# Patient Record
Sex: Female | Born: 1935 | Race: Black or African American | Hispanic: No | Smoking: Never smoker
Health system: Southern US, Community
[De-identification: ages and names within clinical notes are randomized; demographics above are authoritative.]

## PROBLEM LIST (undated history)

## (undated) DIAGNOSIS — D72819 Decreased white blood cell count, unspecified: Secondary | ICD-10-CM

## (undated) DIAGNOSIS — I499 Cardiac arrhythmia, unspecified: Secondary | ICD-10-CM

## (undated) DIAGNOSIS — R112 Nausea with vomiting, unspecified: Secondary | ICD-10-CM

## (undated) DIAGNOSIS — M1990 Unspecified osteoarthritis, unspecified site: Secondary | ICD-10-CM

## (undated) DIAGNOSIS — Z9889 Other specified postprocedural states: Secondary | ICD-10-CM

## (undated) DIAGNOSIS — T4145XA Adverse effect of unspecified anesthetic, initial encounter: Secondary | ICD-10-CM

## (undated) DIAGNOSIS — T8859XA Other complications of anesthesia, initial encounter: Secondary | ICD-10-CM

## (undated) DIAGNOSIS — I10 Essential (primary) hypertension: Secondary | ICD-10-CM

## (undated) DIAGNOSIS — E785 Hyperlipidemia, unspecified: Secondary | ICD-10-CM

## (undated) HISTORY — PX: APPENDECTOMY: SHX54

## (undated) HISTORY — PX: BREAST BIOPSY: SHX20

## (undated) HISTORY — PX: EYE SURGERY: SHX253

## (undated) HISTORY — PX: ABDOMINAL HYSTERECTOMY: SHX81

---

## 2003-11-07 ENCOUNTER — Ambulatory Visit: Payer: Self-pay | Admitting: Family Medicine

## 2004-04-24 ENCOUNTER — Ambulatory Visit: Payer: Self-pay | Admitting: Family Medicine

## 2004-04-28 ENCOUNTER — Ambulatory Visit: Payer: Self-pay | Admitting: Family Medicine

## 2004-09-15 ENCOUNTER — Ambulatory Visit: Payer: Self-pay | Admitting: Gastroenterology

## 2004-11-07 ENCOUNTER — Ambulatory Visit: Payer: Self-pay | Admitting: Internal Medicine

## 2005-11-09 ENCOUNTER — Ambulatory Visit: Payer: Self-pay | Admitting: Internal Medicine

## 2006-11-11 ENCOUNTER — Ambulatory Visit: Payer: Self-pay | Admitting: Internal Medicine

## 2007-11-14 ENCOUNTER — Ambulatory Visit: Payer: Self-pay | Admitting: Internal Medicine

## 2008-11-14 ENCOUNTER — Ambulatory Visit: Payer: Self-pay | Admitting: Internal Medicine

## 2009-11-15 ENCOUNTER — Ambulatory Visit: Payer: Self-pay | Admitting: Internal Medicine

## 2010-11-18 ENCOUNTER — Ambulatory Visit: Payer: Self-pay | Admitting: Internal Medicine

## 2011-04-15 ENCOUNTER — Ambulatory Visit: Payer: Self-pay | Admitting: Internal Medicine

## 2011-11-19 ENCOUNTER — Ambulatory Visit: Payer: Self-pay | Admitting: Internal Medicine

## 2012-11-22 ENCOUNTER — Ambulatory Visit: Payer: Self-pay | Admitting: Internal Medicine

## 2013-03-11 ENCOUNTER — Ambulatory Visit: Payer: Self-pay | Admitting: Family Medicine

## 2013-03-11 ENCOUNTER — Observation Stay: Payer: Self-pay | Admitting: Surgery

## 2013-03-11 LAB — COMPREHENSIVE METABOLIC PANEL
ALBUMIN: 4.1 g/dL (ref 3.4–5.0)
ALT: 24 U/L (ref 12–78)
ANION GAP: 11 (ref 7–16)
AST: 21 U/L (ref 15–37)
Alkaline Phosphatase: 133 U/L — ABNORMAL HIGH
BILIRUBIN TOTAL: 0.4 mg/dL (ref 0.2–1.0)
BUN: 19 mg/dL — ABNORMAL HIGH (ref 7–18)
Calcium, Total: 9.4 mg/dL (ref 8.5–10.1)
Chloride: 102 mmol/L (ref 98–107)
Co2: 26 mmol/L (ref 21–32)
Creatinine: 1.01 mg/dL (ref 0.60–1.30)
EGFR (Non-African Amer.): 54 — ABNORMAL LOW
Glucose: 134 mg/dL — ABNORMAL HIGH (ref 65–99)
Osmolality: 282 (ref 275–301)
POTASSIUM: 4.5 mmol/L (ref 3.5–5.1)
Sodium: 139 mmol/L (ref 136–145)
Total Protein: 8.2 g/dL (ref 6.4–8.2)

## 2013-03-11 LAB — CBC WITH DIFFERENTIAL/PLATELET
Basophil #: 0 10*3/uL (ref 0.0–0.1)
Basophil %: 0.4 %
EOS ABS: 0 10*3/uL (ref 0.0–0.7)
EOS PCT: 0.1 %
HCT: 41.3 % (ref 35.0–47.0)
HGB: 13.5 g/dL (ref 12.0–16.0)
Lymphocyte #: 0.6 10*3/uL — ABNORMAL LOW (ref 1.0–3.6)
Lymphocyte %: 7.8 %
MCH: 31.2 pg (ref 26.0–34.0)
MCHC: 32.7 g/dL (ref 32.0–36.0)
MCV: 95 fL (ref 80–100)
MONO ABS: 0.2 x10 3/mm (ref 0.2–0.9)
Monocyte %: 1.9 %
NEUTROS PCT: 89.8 %
Neutrophil #: 7.3 10*3/uL — ABNORMAL HIGH (ref 1.4–6.5)
PLATELETS: 211 10*3/uL (ref 150–440)
RBC: 4.34 10*6/uL (ref 3.80–5.20)
RDW: 14.2 % (ref 11.5–14.5)
WBC: 8.1 10*3/uL (ref 3.6–11.0)

## 2013-03-11 LAB — URINALYSIS, COMPLETE
Bilirubin,UR: NEGATIVE
GLUCOSE, UR: NEGATIVE mg/dL (ref 0–75)
Ketone: NEGATIVE
LEUKOCYTE ESTERASE: NEGATIVE
NITRITE: NEGATIVE
Ph: 6 (ref 4.5–8.0)
Protein: 30
Specific Gravity: 1.02 (ref 1.003–1.030)

## 2013-03-11 LAB — SEDIMENTATION RATE: ERYTHROCYTE SED RATE: 42 mm/h — AB (ref 0–30)

## 2013-03-11 LAB — LIPASE, BLOOD: Lipase: 135 U/L (ref 73–393)

## 2013-03-14 LAB — PATHOLOGY REPORT

## 2013-12-21 DIAGNOSIS — R0902 Hypoxemia: Secondary | ICD-10-CM | POA: Insufficient documentation

## 2014-01-11 ENCOUNTER — Ambulatory Visit: Payer: Self-pay | Admitting: Internal Medicine

## 2014-05-26 NOTE — Op Note (Signed)
PATIENT NAME:  Anita Horne, Anita Horne MR#:  [id number] DATE OF BIRTH:  20-Jul-1933  DATE OF PROCEDURE:  03/11/2013  PREOPERATIVE DIAGNOSIS:  Acute appendicitis.   POSTOPERATIVE DIAGNOSIS:  Acute appendicitis, ruptured.   PROCEDURE PERFORMED:  Laparoscopic appendectomy.   ESTIMATED BLOOD LOSS:  10 mL.  COMPLICATIONS:  None.   SPECIMEN:  Appendix.   INDICATION FOR SURGERY:  [name] is a pleasant 81 year old female who presents with acute onset right lower quadrant pain and CT findings concerning for appendicitis.   DETAILS OF PROCEDURE:  Informed consent was obtained.  [name] was brought to the Operating Room.  She was laid supine on the Operating Room table.  She was induced.  Endotracheal tube was placed.  General anesthesia was administered.  Her abdomen was then prepped and draped in a standard surgical fashion.  A timeout was then performed correctly identifying the patient name, operative site and procedure to be performed.  A supraumbilical incision was made and deepened to her fascia.  The fascia was incised.  The peritoneum was entered.  Two stay sutures were placed through the fasciotomy.  A left lower quadrant and suprapubic 5 mm trocars are placed.  The appendix was visualized.  It appeared to be more in the right upper quadrant region.  The terminal ileum was adhesed to the right sidewall and the right colon was quite cephalad.  The defect was made in the mesoappendix.  There was a fecalith at the base of the cecum, but also was perforation distally with a small amount of feculent material.  The mesoappendix was then taken down with a stapler.  The appendix was taken out with an Endo Catch bag through the umbilicus.  The staple lines were examined.  They were noted to be hemostatic.  I did put Surgicel as the patient is a Jehovah's Witness at the staple lines and irrigated the abdomen.  Once I was happy with hemostasis, the trocars were removed under direct visualization.  The  supraumbilical fascia was closed with a figure of eight 0 Vicryl.  4-0 Monocryl deep dermal sutures were used to close the skin.  Steri-Strips, Telfa gauze and Tegaderm were then used to complete the dressing.  The patient was then awoken, extubated and brought to the postanesthesia care unit.  There were no immediate complications.  Needle, sponge, and instrument count was correct at the end of the procedure.    ____________________________ Si Raiderhristopher A. Samera Macy, MD cal:ea D: 03/11/2013 22:50:20 ET T: 03/11/2013 23:17:41 ET JOB#: [id number]  cc: Cristal Deerhristopher A. Camrynn Mcclintic, MD, <Dictator> Jarvis NewcomerHRISTOPHER A Andrius Andrepont MD ELECTRONICALLY SIGNED 03/12/2013 23:55

## 2014-05-26 NOTE — H&P (Signed)
History of Present Illness 29 yof who developed RLQ pain and nausea (no vomiting, anorexia, or fever) last night at 11 PM. She wasn't able to sleep. Last BM nl, has been NPO since 3 PM yesterday (24 hours).   Past Med/Surgical Hx:  pulmonary hypertension:   Atrial Fibrillation:   Hypertension:   ALLERGIES:  General anesthesia: N/V/Diarrhea  HOME MEDICATIONS: Medication Instructions Status  acetaminophen-codeine 300 mg-30 mg oral tablet 1 tab(s) orally every 6 hours as needed for pain.  May cause drowsiness. Active  ondansetron 4 mg oral tablet, disintegrating 1 tab(s) orally every 6 hours as needed for nausea Active  fluticasone nasal 50 mcg/inh spray 1 spray(s) to each nostril once a day for sinus congestion Active  Aspirin 325 milligram(s) orally once a day Active  diltiazem  orally  Active  multivitamin  orally  Active   Family and Social History:  Family History Non-Contributory   Social History negative tobacco, negative ETOH, negative Illicit drugs, Jehovah's Witness, retired Lawyer, lives alone, no smoking or ETOH.   Place of Living Home   Review of Systems:  Fever/Chills No   Cough No   Sputum No   Abdominal Pain Yes   Diarrhea No   Constipation No   Nausea/Vomiting Yes   SOB/DOE No   Chest Pain No   Dysuria No   Tolerating PT Yes   Tolerating Diet Yes  Nauseated   Medications/Allergies Reviewed Medications/Allergies reviewed   Physical Exam:  GEN well developed, well nourished, obese   HEENT pink conjunctivae, PERRL, hearing intact to voice, moist oral mucosa, Oropharynx clear   NECK supple  No masses  trachea midline   RESP normal resp effort  clear BS  no use of accessory muscles   CARD regular rate  no murmur  No LE edema  no JVD  no Rub   ABD positive tenderness  obese, RLQ tenderness with guarding   LYMPH negative neck   EXTR negative cyanosis/clubbing, negative edema   SKIN normal to palpation, No rashes, skin turgor good    NEURO cranial nerves intact, negative tremor, follows commands, motor/sensory function intact   PSYCH alert, A+O to time, place, person, good insight   Radiology Results: LabUnknown:    07-Feb-15 13:40, CT Abdomen Pelvis WO for Stone  PACS Image  CT:  CT Abdomen Pelvis WO for Stone  REASON FOR EXAM:    RLQ pain and Hematuria  COMMENTS:       PROCEDURE: CT  - CT ABDOMEN /PELVIS WO (STONE)  - Mar 11 2013  1:40PM     CLINICAL DATA:  Right lower quadrant pain    EXAM:  CT ABDOMEN AND PELVIS WITHOUT CONTRAST    TECHNIQUE:  Multidetector CT imaging of the abdomen and pelvis was performed  following the standard protocol without IV contrast.    COMPARISON:  None.  FINDINGS:  Lung bases are well aerated. A calcified granuloma is noted in the  left lung base.    The liver, spleen andpancreas are within normal limits. The adrenal  glands are mildly prominent bilaterally consistent with hyperplasia.  No focal mass lesion is seen. The gallbladder is well distended and  demonstrates multiple calcified gallstones. No pericholecystic fluid  or gallbladder wall thickening is noted.    The kidneys are well visualized and demonstrate no mass lesion or  hydronephrosis. No calculi or obstructive changes are noted.    The appendix is dilated with evidence of a central. Periappendiceal  inflammatory changes are  noted consistent with acute appendicitis.  Diverticular changes noted within the colon without diverticulitis.  The bladder is decompressed. No pelvic mass lesion is seen. The  uterus has been surgically removed. The bony structures are within  normal limits for the patient's given age. Marland Kitchen.     IMPRESSION:  Acute appendicitis.    Cholelithiasis without complicating factors.      Electronically Signed    By: Alcide CleverMark  Lukens M.D.    On: 03/11/2013 13:43     Verified By: Phillips OdorMARK L. LUKENS, M.D.,    Assessment/Admission Diagnosis Acute appendicitis   Plan Admit, IVF, IV ABx, lap appy    Electronic Signatures: Claude MangesMarterre, Morgon Pamer F (MD)  (Signed 07-Feb-15 15:34)  Authored: CHIEF COMPLAINT and HISTORY, PAST MEDICAL/SURGIAL HISTORY, ALLERGIES, HOME MEDICATIONS, FAMILY AND SOCIAL HISTORY, REVIEW OF SYSTEMS, PHYSICAL EXAM, Radiology, ASSESSMENT AND PLAN   Last Updated: 07-Feb-15 15:34 by Claude MangesMarterre, Keva Darty F (MD)

## 2014-06-28 ENCOUNTER — Other Ambulatory Visit: Payer: Self-pay | Admitting: Internal Medicine

## 2014-06-28 DIAGNOSIS — Z1231 Encounter for screening mammogram for malignant neoplasm of breast: Secondary | ICD-10-CM

## 2014-07-11 ENCOUNTER — Ambulatory Visit
Admission: RE | Admit: 2014-07-11 | Discharge: 2014-07-11 | Disposition: A | Discharge status: Home / Self Care | Payer: Medicare Other | Source: Ambulatory Visit | Attending: Internal Medicine | Admitting: Internal Medicine

## 2014-07-11 DIAGNOSIS — Z1231 Encounter for screening mammogram for malignant neoplasm of breast: Secondary | ICD-10-CM | POA: Diagnosis not present

## 2014-11-22 ENCOUNTER — Encounter: Payer: Self-pay | Admitting: Radiology

## 2014-11-22 ENCOUNTER — Emergency Department
Admission: EM | Admit: 2014-11-22 | Discharge: 2014-11-22 | Disposition: A | Discharge status: Home / Self Care | Payer: Medicare Other | Attending: Student | Admitting: Student

## 2014-11-22 ENCOUNTER — Emergency Department: Payer: Medicare Other

## 2014-11-22 ENCOUNTER — Other Ambulatory Visit: Payer: Self-pay

## 2014-11-22 DIAGNOSIS — R350 Frequency of micturition: Secondary | ICD-10-CM | POA: Insufficient documentation

## 2014-11-22 DIAGNOSIS — R1010 Upper abdominal pain, unspecified: Secondary | ICD-10-CM | POA: Diagnosis present

## 2014-11-22 DIAGNOSIS — R109 Unspecified abdominal pain: Secondary | ICD-10-CM | POA: Insufficient documentation

## 2014-11-22 DIAGNOSIS — I10 Essential (primary) hypertension: Secondary | ICD-10-CM | POA: Insufficient documentation

## 2014-11-22 HISTORY — DX: Essential (primary) hypertension: I10

## 2014-11-22 LAB — CBC
HCT: 41.6 % (ref 35.0–47.0)
Hemoglobin: 13.8 g/dL (ref 12.0–16.0)
MCH: 30.9 pg (ref 26.0–34.0)
MCHC: 33.3 g/dL (ref 32.0–36.0)
MCV: 92.9 fL (ref 80.0–100.0)
PLATELETS: 194 10*3/uL (ref 150–440)
RBC: 4.48 MIL/uL (ref 3.80–5.20)
RDW: 15.4 % — AB (ref 11.5–14.5)
WBC: 8.9 10*3/uL (ref 3.6–11.0)

## 2014-11-22 LAB — URINALYSIS COMPLETE WITH MICROSCOPIC (ARMC ONLY)
BILIRUBIN URINE: NEGATIVE
Bacteria, UA: NONE SEEN
GLUCOSE, UA: NEGATIVE mg/dL
KETONES UR: NEGATIVE mg/dL
NITRITE: NEGATIVE
PH: 6 (ref 5.0–8.0)
Protein, ur: NEGATIVE mg/dL
Specific Gravity, Urine: 1.012 (ref 1.005–1.030)

## 2014-11-22 LAB — COMPREHENSIVE METABOLIC PANEL
ALT: 15 U/L (ref 14–54)
AST: 22 U/L (ref 15–41)
Albumin: 4 g/dL (ref 3.5–5.0)
Alkaline Phosphatase: 99 U/L (ref 38–126)
Anion gap: 6 (ref 5–15)
BILIRUBIN TOTAL: 0.7 mg/dL (ref 0.3–1.2)
BUN: 18 mg/dL (ref 6–20)
CALCIUM: 9.2 mg/dL (ref 8.9–10.3)
CHLORIDE: 107 mmol/L (ref 101–111)
CO2: 25 mmol/L (ref 22–32)
CREATININE: 0.97 mg/dL (ref 0.44–1.00)
GFR, EST NON AFRICAN AMERICAN: 54 mL/min — AB (ref >60)
Glucose, Bld: 122 mg/dL — ABNORMAL HIGH (ref 65–99)
Potassium: 3.6 mmol/L (ref 3.5–5.1)
Sodium: 138 mmol/L (ref 135–145)
TOTAL PROTEIN: 7.8 g/dL (ref 6.5–8.1)

## 2014-11-22 LAB — LACTIC ACID, PLASMA: Lactic Acid, Venous: 0.9 mmol/L (ref 0.5–2.0)

## 2014-11-22 LAB — LIPASE, BLOOD: LIPASE: 35 U/L (ref 11–51)

## 2014-11-22 MED ORDER — SODIUM CHLORIDE 0.9 % IV BOLUS (SEPSIS)
500.0000 mL | Freq: Once | INTRAVENOUS | Status: AC
  Administered 2014-11-22: 500 mL via INTRAVENOUS

## 2014-11-22 MED ORDER — IOHEXOL 240 MG/ML SOLN
25.0000 mL | Freq: Once | INTRAMUSCULAR | Status: AC | PRN
  Administered 2014-11-22: 25 mL via ORAL

## 2014-11-22 MED ORDER — GI COCKTAIL ~~LOC~~
30.0000 mL | Freq: Once | ORAL | Status: AC
  Administered 2014-11-22: 30 mL via ORAL
  Filled 2014-11-22: qty 30

## 2014-11-22 MED ORDER — ACETAMINOPHEN 500 MG PO TABS
1000.0000 mg | ORAL_TABLET | Freq: Once | ORAL | Status: AC
  Administered 2014-11-22: 1000 mg via ORAL
  Filled 2014-11-22: qty 2

## 2014-11-22 MED ORDER — IOHEXOL 300 MG/ML  SOLN
100.0000 mL | Freq: Once | INTRAMUSCULAR | Status: AC | PRN
  Administered 2014-11-22: 100 mL via INTRAVENOUS

## 2014-11-22 NOTE — Consult Note (Signed)
Surgery Consult Note  CC: LLQ pain x 1 day, now resolved HPI: [name] is a pleasant 81 yo F with a history of HTN and afib which is rate controlled and for which she is not on anticoagulation who presents with 1 day of gradual onset LLQ pain.  Now completely resolved.  Has never had this pain before.  No associated fevers/chills, night sweats, shortness of breath, cough, chest pain, nausea/vomiting, diarrhea/constipation, dysuria/hematuria.  Active Ambulatory Problems    Diagnosis Date Noted  . No Active Ambulatory Problems   Resolved Ambulatory Problems    Diagnosis Date Noted  . No Resolved Ambulatory Problems   Past Medical History  Diagnosis Date  . Hypertension    Past Surgical History  Procedure Laterality Date  . Breast biopsy Bilateral     1950's-neg  . Breast biopsy Left     bx/clip-neg  H/o appendectomy  Medication list Cardizem  Allergies  Allergen Reactions  . Citrus    Social History   Social History  . Marital Status: Widowed    Spouse Name: N/A  . Number of Children: N/A  . Years of Education: N/A   Occupational History  . Not on file.   Social History Main Topics  . Smoking status: Not on file  . Smokeless tobacco: Not on file  . Alcohol Use: Not on file  . Drug Use: Not on file  . Sexual Activity: Not on file   Other Topics Concern  . Not on file   Social History Narrative   Family History  Problem Relation Age of Onset  . Breast cancer Sister    ROS: Full ROS obtained, pertinent positives and negatives as above.  Blood pressure 155/76, pulse 97, temperature 99.6 F (37.6 C), temperature source Oral, resp. rate 16, height 5' (1.524 m), weight 225 lb (102.059 kg), SpO2 96 %. GEN: NAD/A&Ox3 FACE: no obvious facial trauma, normal external nose, normal external ears EYES: no scleral icterus, no conjunctivitis HEAD: normocephalic atraumatic CV: RRR, no MRG RESP: moving air well, lungs clear ABD: soft, nontender, nondistended EXT:  moving all ext well, strength 5/5 NEURO: cnII-XII grossly intact, sensation intact all 4 ext  Labs: Personally reviewed, significant for WBC 8.9 Bicarb 25  Imaging: Personally reviewed, significant for  Focal LLQ SB thickening without obstruction, inflammation  A/P 81 yo with LLQ pain, now resolved.  Small bowel thickening may be due to infection but does not have symptoms associated with enteritis (ie, N/V, diarrhea) or ischemia which patient does not have pain or acidosis to suggest this.  No indication for admission, will require follow up imaging as outpatient (CT enterography in 2-4 weeks) to ensure no malignancy.  I have explained this to patient and patient daughter who express understanding.  I also have stressed to call or return to ER if she develops increasing pain, nausea/vomiting, fevers.

## 2014-11-22 NOTE — ED Provider Notes (Signed)
[facility] Emergency Department Provider Note  ____________________________________________  Time seen: Approximately 6:17 PM  I have reviewed the triage vital signs and the nursing notes.   HISTORY  Chief Complaint Abdominal Pain    HPI Esther Hardyrene F Seago is a 81 y.o. female with history of hypertension and atrial fibrillation, not chronically anticoagulated who presents for evaluation of gradual onset constant sharp pain throughout the upper abdomen and the left lower quadrant which began last night and lasted all throughout today, they have improved since 5 PM. No modifying factors. Current severity of symptoms is mild to moderate. No nausea, vomiting, diarrhea, fevers, chills, chest pain or difficulty breathing. She has had increased urinary frequency. She has never had any pain like this before.   Past Medical History  Diagnosis Date  . Hypertension     There are no active problems to display for this patient.   Past Surgical History  Procedure Laterality Date  . Breast biopsy Bilateral     1950's-neg  . Breast biopsy Left     bx/clip-neg    No current outpatient prescriptions on file.  Allergies Citrus  Family History  Problem Relation Age of Onset  . Breast cancer Sister     Social History Social History  Substance Use Topics  . Smoking status: None  . Smokeless tobacco: None  . Alcohol Use: None    Review of Systems Constitutional: No fever/chills Eyes: No visual changes. ENT: No sore throat. Cardiovascular: Denies chest pain. Respiratory: Denies shortness of breath. Gastrointestinal: + abdominal pain.  No nausea, no vomiting.  No diarrhea.  No constipation. Genitourinary: Negative for dysuria. Musculoskeletal: Negative for back pain. Skin: Negative for rash. Neurological: Negative for headaches, focal weakness or numbness.  10-point ROS otherwise negative.  ____________________________________________   PHYSICAL  EXAM:  VITAL SIGNS: ED Triage Vitals  Enc Vitals Group     BP 11/22/14 1744 144/72 mmHg     Pulse Rate 11/22/14 1744 98     Resp 11/22/14 1744 16     Temp 11/22/14 1744 99 F (37.2 C)     Temp Source 11/22/14 1744 Oral     SpO2 11/22/14 1744 100 %     Weight 11/22/14 1744 225 lb (102.059 kg)     Height 11/22/14 1744 5' (1.524 m)     Head Cir --      Peak Flow --      Pain Score 11/22/14 1745 5     Pain Loc --      Pain Edu? --      Excl. in GC? --     Constitutional: Alert and oriented. Well appearing and in no acute distress. Eyes: Conjunctivae are normal. PERRL. EOMI. Head: Atraumatic. Nose: No congestion/rhinnorhea. Mouth/Throat: Mucous membranes are moist.  Oropharynx non-erythematous. Neck: No stridor.  Cardiovascular: Normal rate, regular rhythm. Grossly normal heart sounds.  Good peripheral circulation. Respiratory: Normal respiratory effort.  No retractions. Lungs CTAB. Gastrointestinal: Soft and nontender. No distention. No CVA tenderness. Genitourinary: deferred Musculoskeletal: No lower extremity tenderness nor edema.  No joint effusions. Neurologic:  Normal speech and language. No gross focal neurologic deficits are appreciated. No gait instability. Skin:  Skin is warm, dry and intact. No rash noted. Psychiatric: Mood and affect are normal. Speech and behavior are normal.  ____________________________________________   LABS (all labs ordered are listed, but only abnormal results are displayed)  Labs Reviewed  COMPREHENSIVE METABOLIC PANEL - Abnormal; Notable for the following:    Glucose, Bld 122 (*)  GFR calc non Af Amer 54 (*)    All other components within normal limits  CBC - Abnormal; Notable for the following:    RDW 15.4 (*)    All other components within normal limits  URINALYSIS COMPLETEWITH MICROSCOPIC ([facility] ONLY) - Abnormal; Notable for the following:    Color, Urine YELLOW (*)    APPearance CLEAR (*)    Hgb urine dipstick 1+ (*)     Leukocytes, UA TRACE (*)    Squamous Epithelial / LPF 0-5 (*)    All other components within normal limits  LIPASE, BLOOD  LACTIC ACID, PLASMA   ____________________________________________  EKG  ED ECG REPORT I, Gayla Doss, the attending physician, personally viewed and interpreted this ECG.   Date: 11/22/2014  EKG Time: 19:04  Rate:80  Rhythm: atrial fibrillation, rate 80  Axis: normal  Intervals:right bundle branch block  ST&T Change: No acute ST elevation. Right bundle branch block. Q wave in lead 3 is nonspecific.  ____________________________________________  RADIOLOGY  CT abdomen and pelvis IMPRESSION: 1. Abnormal short segment loop of mid small bowel wall thickening without resulting obstruction. This could reflect focal inflammation, ischemia or infiltrating neoplasm (less likely). Follow up CT enterography in 2-4 weeks recommended to exclude mass lesion. 2. Moderate atherosclerosis without evidence of large vessel occlusion. 3. Cholelithiasis without evidence of cholecystitis. 4. Moderate distal colonic diverticulosis. ____________________________________________   PROCEDURES  Procedure(s) performed: None  Critical Care performed: No  ____________________________________________   INITIAL IMPRESSION / ASSESSMENT AND PLAN / ED COURSE  Pertinent labs & imaging results that were available during my care of the patient were reviewed by me and considered in my medical decision making (see chart for details).  CLEOTA PELLERITO is a 81 y.o. female with history of hypertension and atrial fibrillation, not chronically anticoagulated who presents for evaluation of gradual onset constant sharp pain throughout the upper abdomen and the left lower quadrant. On exam, she is very well-appearing and in no acute distress. Vital signs stable, she is afebrile. She has normal bowel sounds and no tenderness to palpation throughout the abdomen at this time and her pain is  improving however given her age and symptoms, plan for screening labs, UA, screening EKG as well as CT of the abdomen and pelvis to further evaluate cause of her pain.   ----------------------------------------- 9:55 PM on 11/22/2014 -----------------------------------------  Patient continues to feel well. Screening EKG shows known A. fib, not consistent with acute ischemia. Labs reviewed. CBC, CMP lipase unremarkable. Urinalysis with trace leukocytes but generally not consistent with urinary tract infection. Urine culture sent. CT abdomen and pelvis shows cholelithiasis as well as a loop of small bowel with wall thickening that could represent inflammation or ischemia. I discussed this with Dr. Juliann Pulse of General surgery who has evaluated the patient and reports that ischemia is unlikely given that she has a normal bicarbonate, no pain currently, reassuring venous lactic acid. She'll follow-up with her primary care doctor for repeat CT scan in 2-4 weeks to ensure no malignancy. This was discussed with the patient as well as her family at bedside and all her comfort with discharge. Additionally, return precautions discussed and she is comfortable with the discharge plan. ____________________________________________   FINAL CLINICAL IMPRESSION(S) / ED DIAGNOSES  Final diagnoses:  Abdominal pain, unspecified abdominal location      Gayla Doss, MD 11/22/14 2213

## 2014-11-22 NOTE — Discharge Instructions (Signed)
You were seen in the emergency department for abdominal pain, may be partially related to gallstones. Additionally, an abnormal finding was noted on your CT scan associated with your small bowel. You will need a repeat CT scan in 2-4 weeks to further evaluate today's findings. Follow up with your primary care doctor as soon as possible. Your doctor will help arrange/schedule the repeat CT scan. Please call the general surgeon's office, Dr. Juliann PulseLundquist, in the morning to schedule an appointment to be seen about your gallstones. Return immediately to the emergency department if you develop severe or worsening abdominal pain, vomiting, blood in vomit or stool, inability to have bowel movement, chest pain, difficulty breathing, lightheadedness, fainting, fever greater than 100.4, or for any other concerns.

## 2014-11-22 NOTE — ED Notes (Signed)
Pt reports diffuse abdominal pain that started last night and lasted until this morning. Pt denies nausea, vomiting, diarrhea. Pt denies shortness of breath.

## 2014-11-22 NOTE — ED Notes (Signed)
Patient to CT.

## 2014-11-24 LAB — URINE CULTURE

## 2014-12-04 DIAGNOSIS — R9389 Abnormal findings on diagnostic imaging of other specified body structures: Secondary | ICD-10-CM | POA: Insufficient documentation

## 2014-12-04 DIAGNOSIS — K8020 Calculus of gallbladder without cholecystitis without obstruction: Secondary | ICD-10-CM | POA: Insufficient documentation

## 2014-12-05 ENCOUNTER — Other Ambulatory Visit: Payer: Self-pay | Admitting: Internal Medicine

## 2014-12-05 DIAGNOSIS — R933 Abnormal findings on diagnostic imaging of other parts of digestive tract: Secondary | ICD-10-CM

## 2014-12-14 ENCOUNTER — Ambulatory Visit
Admission: RE | Admit: 2014-12-14 | Discharge: 2014-12-14 | Disposition: A | Discharge status: Home / Self Care | Payer: Medicare Other | Source: Ambulatory Visit | Attending: Internal Medicine | Admitting: Internal Medicine

## 2014-12-14 DIAGNOSIS — R933 Abnormal findings on diagnostic imaging of other parts of digestive tract: Secondary | ICD-10-CM | POA: Insufficient documentation

## 2014-12-14 DIAGNOSIS — K5730 Diverticulosis of large intestine without perforation or abscess without bleeding: Secondary | ICD-10-CM | POA: Diagnosis not present

## 2014-12-14 DIAGNOSIS — K8020 Calculus of gallbladder without cholecystitis without obstruction: Secondary | ICD-10-CM | POA: Insufficient documentation

## 2014-12-14 DIAGNOSIS — R911 Solitary pulmonary nodule: Secondary | ICD-10-CM | POA: Insufficient documentation

## 2014-12-14 MED ORDER — IOHEXOL 350 MG/ML SOLN
100.0000 mL | Freq: Once | INTRAVENOUS | Status: AC | PRN
  Administered 2014-12-14: 100 mL via INTRAVENOUS

## 2015-06-12 ENCOUNTER — Encounter: Payer: Self-pay | Admitting: *Deleted

## 2015-06-13 ENCOUNTER — Ambulatory Visit: Payer: Medicare Other | Admitting: Certified Registered"

## 2015-06-13 ENCOUNTER — Ambulatory Visit
Admission: RE | Admit: 2015-06-13 | Discharge: 2015-06-13 | Disposition: A | Discharge status: Home / Self Care | Payer: Medicare Other | Source: Ambulatory Visit | Attending: Gastroenterology | Admitting: Gastroenterology

## 2015-06-13 ENCOUNTER — Encounter
Admission: RE | Disposition: A | Discharge status: Home / Self Care | Payer: Self-pay | Source: Ambulatory Visit | Attending: Gastroenterology

## 2015-06-13 ENCOUNTER — Encounter: Payer: Self-pay | Admitting: *Deleted

## 2015-06-13 DIAGNOSIS — I10 Essential (primary) hypertension: Secondary | ICD-10-CM | POA: Insufficient documentation

## 2015-06-13 DIAGNOSIS — Z7982 Long term (current) use of aspirin: Secondary | ICD-10-CM | POA: Insufficient documentation

## 2015-06-13 DIAGNOSIS — Z79899 Other long term (current) drug therapy: Secondary | ICD-10-CM | POA: Insufficient documentation

## 2015-06-13 DIAGNOSIS — K2950 Unspecified chronic gastritis without bleeding: Secondary | ICD-10-CM | POA: Insufficient documentation

## 2015-06-13 DIAGNOSIS — K210 Gastro-esophageal reflux disease with esophagitis: Secondary | ICD-10-CM | POA: Diagnosis not present

## 2015-06-13 DIAGNOSIS — R1032 Left lower quadrant pain: Secondary | ICD-10-CM | POA: Diagnosis present

## 2015-06-13 DIAGNOSIS — E785 Hyperlipidemia, unspecified: Secondary | ICD-10-CM | POA: Diagnosis not present

## 2015-06-13 HISTORY — DX: Other specified postprocedural states: Z98.890

## 2015-06-13 HISTORY — DX: Other specified postprocedural states: R11.2

## 2015-06-13 HISTORY — DX: Hyperlipidemia, unspecified: E78.5

## 2015-06-13 HISTORY — PX: ESOPHAGOGASTRODUODENOSCOPY (EGD) WITH PROPOFOL: SHX5813

## 2015-06-13 HISTORY — DX: Other complications of anesthesia, initial encounter: T88.59XA

## 2015-06-13 HISTORY — DX: Adverse effect of unspecified anesthetic, initial encounter: T41.45XA

## 2015-06-13 HISTORY — DX: Decreased white blood cell count, unspecified: D72.819

## 2015-06-13 SURGERY — ESOPHAGOGASTRODUODENOSCOPY (EGD) WITH PROPOFOL
Anesthesia: General

## 2015-06-13 MED ORDER — PROPOFOL 500 MG/50ML IV EMUL
INTRAVENOUS | Status: DC | PRN
  Administered 2015-06-13: 120 ug/kg/min via INTRAVENOUS

## 2015-06-13 MED ORDER — PROPOFOL 10 MG/ML IV BOLUS
INTRAVENOUS | Status: DC | PRN
  Administered 2015-06-13: 50 mg via INTRAVENOUS
  Administered 2015-06-13: 20 mg via INTRAVENOUS

## 2015-06-13 MED ORDER — SODIUM CHLORIDE 0.9 % IV SOLN
INTRAVENOUS | Status: DC
  Administered 2015-06-13: 09:00:00 via INTRAVENOUS

## 2015-06-13 MED ORDER — LIDOCAINE HCL (CARDIAC) 20 MG/ML IV SOLN
INTRAVENOUS | Status: DC | PRN
  Administered 2015-06-13: 50 mg via INTRAVENOUS

## 2015-06-13 MED ORDER — SODIUM CHLORIDE 0.9 % IV SOLN: INTRAVENOUS | Status: DC

## 2015-06-13 MED ORDER — GLYCOPYRROLATE 0.2 MG/ML IJ SOLN
INTRAMUSCULAR | Status: DC | PRN
  Administered 2015-06-13: 0.2 mg via INTRAVENOUS

## 2015-06-13 NOTE — Transfer of Care (Signed)
Immediate Anesthesia Transfer of Care Note  Patient: Anita Horne  Procedure(s) Performed: Procedure(s): ESOPHAGOGASTRODUODENOSCOPY (EGD) WITH PROPOFOL (N/A)  Patient Location: Endoscopy Unit  Anesthesia Type:General  Level of Consciousness: awake  Airway & Oxygen Therapy: Patient Spontanous Breathing and Patient connected to nasal cannula oxygen  Post-op Assessment: Report given to RN  Post vital signs: Reviewed  Last Vitals:  Filed Vitals:   06/13/15 0816 06/13/15 0912  BP: 167/76 103/63  Pulse: 85 99  Temp: 36.6 C 36.3 C  Resp: 14 20    Last Pain: There were no vitals filed for this visit.       Complications: No apparent anesthesia complications

## 2015-06-13 NOTE — Anesthesia Preprocedure Evaluation (Signed)
Anesthesia Evaluation  Patient identified by MRN, date of birth, ID band Patient awake    Reviewed: Allergy & Precautions, H&P , NPO status , Patient's Chart, lab work & pertinent test results, reviewed documented beta blocker date and time   History of Anesthesia Complications (+) PONV and history of anesthetic complications  Airway Mallampati: III  TM Distance: >3 FB Neck ROM: full    Dental no notable dental hx. (+) Edentulous Upper, Edentulous Lower, Upper Dentures, Lower Dentures   Pulmonary neg shortness of breath, neg sleep apnea, neg COPD, Recent URI ,    Pulmonary exam normal breath sounds clear to auscultation       Cardiovascular Exercise Tolerance: Good hypertension, On Medications (-) angina(-) CAD, (-) Past MI, (-) Cardiac Stents and (-) CABG Normal cardiovascular exam+ dysrhythmias Atrial Fibrillation (-) Valvular Problems/Murmurs Rhythm:regular Rate:Normal     Neuro/Psych negative neurological ROS  negative psych ROS   GI/Hepatic Neg liver ROS, GERD  ,  Endo/Other  negative endocrine ROS  Renal/GU negative Renal ROS  negative genitourinary   Musculoskeletal   Abdominal   Peds  Hematology negative hematology ROS (+)   Anesthesia Other Findings Past Medical History:   Hypertension                                                 Hyperlipidemia                                               Leukopenia                                                   Complication of anesthesia                                   PONV (postoperative nausea and vomiting)                     Reproductive/Obstetrics negative OB ROS                             Anesthesia Physical Anesthesia Plan  ASA: II  Anesthesia Plan: General   Post-op Pain Management:    Induction:   Airway Management Planned:   Additional Equipment:   Intra-op Plan:   Post-operative Plan:   Informed Consent: I  have reviewed the patients History and Physical, chart, labs and discussed the procedure including the risks, benefits and alternatives for the proposed anesthesia with the patient or authorized representative who has indicated his/her understanding and acceptance.   Dental Advisory Given  Plan Discussed with: Anesthesiologist, CRNA and Surgeon  Anesthesia Plan Comments:         Anesthesia Quick Evaluation

## 2015-06-13 NOTE — Anesthesia Postprocedure Evaluation (Signed)
Anesthesia Post Note  Patient: Esther Hardyrene F Milhouse  Procedure(s) Performed: Procedure(s) (LRB): ESOPHAGOGASTRODUODENOSCOPY (EGD) WITH PROPOFOL (N/A)  Patient location during evaluation: Endoscopy Anesthesia Type: General Level of consciousness: awake and alert Pain management: pain level controlled Vital Signs Assessment: post-procedure vital signs reviewed and stable Respiratory status: spontaneous breathing, nonlabored ventilation, respiratory function stable and patient connected to nasal cannula oxygen Cardiovascular status: blood pressure returned to baseline and stable Postop Assessment: no signs of nausea or vomiting Anesthetic complications: no    Last Vitals:  Filed Vitals:   06/13/15 0930 06/13/15 0940  BP: 121/80 142/98  Pulse:    Temp:    Resp:      Last Pain: There were no vitals filed for this visit.               Lenard SimmerAndrew Lashan Gluth

## 2015-06-13 NOTE — H&P (Signed)
    Primary Care Physician:  Lynnea FerrierBERT J KLEIN III, MD Primary Gastroenterologist:  Dr. Bluford Kaufmannh  Pre-Procedure History & Physical: HPI:  Esther Hardyrene F Kopplin is a 82 y.o. female is here for an EGD  Past Medical History  Diagnosis Date  . Hypertension   . Hyperlipidemia   . Leukopenia     Past Surgical History  Procedure Laterality Date  . Breast biopsy Bilateral     1950's-neg  . Breast biopsy Left     bx/clip-neg    Prior to Admission medications   Medication Sig Start Date End Date Taking? Authorizing Provider  aspirin 325 MG tablet Take 325 mg by mouth daily.   Yes Historical Provider, MD  azelastine (ASTELIN) 0.1 % nasal spray Place into both nostrils 2 (two) times daily. Use in each nostril as directed   Yes Historical Provider, MD  calcium carbonate (OSCAL) 1500 (600 Ca) MG TABS tablet Take by mouth 2 (two) times daily with a meal.   Yes Historical Provider, MD  Magnesium 250 MG TABS Take by mouth.   Yes Historical Provider, MD  Multiple Vitamins-Minerals (MULTIVITAMIN WITH MINERALS) tablet Take 1 tablet by mouth daily.   Yes Historical Provider, MD  omega-3 acid ethyl esters (LOVAZA) 1 g capsule Take by mouth 2 (two) times daily.   Yes Historical Provider, MD    Allergies as of 05/04/2015 - Review Complete 12/14/2014  Allergen Reaction Noted  . Citrus  11/22/2014    Family History  Problem Relation Age of Onset  . Breast cancer Sister     Social History   Social History  . Marital Status: Widowed    Spouse Name: N/A  . Number of Children: N/A  . Years of Education: N/A   Occupational History  . Not on file.   Social History Main Topics  . Smoking status: Not on file  . Smokeless tobacco: Not on file  . Alcohol Use: Not on file  . Drug Use: Not on file  . Sexual Activity: Not on file   Other Topics Concern  . Not on file   Social History Narrative    Review of Systems: See HPI, otherwise negative ROS  Physical Exam: There were no vitals taken for this  visit. General:   Alert,  pleasant and cooperative in NAD Head:  Normocephalic and atraumatic. Neck:  Supple; no masses or thyromegaly. Lungs:  Clear throughout to auscultation.    Heart:  Regular rate and rhythm. Abdomen:  Soft, nontender and nondistended. Normal bowel sounds, without guarding, and without rebound.   Neurologic:  Alert and  oriented x4;  grossly normal neurologically.  Impression/Plan: Esther Hardyrene F Hefner is here for an EGD to be performed for LLQ pain, hx of intussusception Risks, benefits, limitations, and alternatives regarding EGD have been reviewed with the patient.  Questions have been answered.  All parties agreeable.   Jhovany Weidinger, Ezzard StandingPAUL Y, MD  06/13/2015, 8:20 AM

## 2015-06-13 NOTE — Op Note (Signed)
[facility] Gastroenterology Patient Name: Anita Horne Procedure Date: 06/13/2015 8:57 AM MRN: [id number] Account #: [id number] Date of Birth: 11-May-1933 Admit Type: Outpatient Age: 82 Room: [facility] ENDO ROOM 3 Gender: Female Note Status: Finalized Procedure:            Upper GI endoscopy Indications:          Abdominal pain in the left lower quadrant, Hx of                        intussuseption. Abnormal Video capsule study Providers:            Ezzard Standing. Bluford Kaufmann, MD Referring MD:         Daniel Nones, MD (Referring MD) Medicines:            Monitored Anesthesia Care Complications:        No immediate complications. Procedure:            Pre-Anesthesia Assessment:                       - Prior to the procedure, a History and Physical was                        performed, and patient medications, allergies and                        sensitivities were reviewed. The patient's tolerance of                        previous anesthesia was reviewed.                       - The risks and benefits of the procedure and the                        sedation options and risks were discussed with the                        patient. All questions were answered and informed                        consent was obtained.                       - After reviewing the risks and benefits, the patient                        was deemed in satisfactory condition to undergo the                        procedure.                       After obtaining informed consent, the endoscope was                        passed under direct vision. Throughout the procedure,                        the patient's blood pressure, pulse, and oxygen  saturations were monitored continuously. The Endoscope                        was introduced through the mouth, and advanced to the                        second part of duodenum. The upper GI endoscopy was                        accomplished without  difficulty. The patient tolerated                        the procedure well. Findings:      LA Grade A (one or more mucosal breaks less than 5 mm, not extending       between tops of 2 mucosal folds) esophagitis was found at the       gastroesophageal junction.      The exam was otherwise without abnormality.      Localized mild inflammation characterized by erythema was found in the       gastric antrum. Biopsies were taken with a cold forceps for histology.      The exam was otherwise without abnormality.      The examined duodenum was normal. Impression:           - LA Grade A reflux esophagitis.                       - The examination was otherwise normal.                       - Gastritis. Biopsied.                       - The examination was otherwise normal.                       - Normal examined duodenum. Recommendation:       - Discharge patient to home.                       - Observe patient's clinical course.                       - Continue present medications.                       - The findings and recommendations were discussed with                        the patient. Procedure Code(s):    --- Professional ---                       [phone], Esophagogastroduodenoscopy, flexible, transoral;                        with biopsy, single or multiple Diagnosis Code(s):    --- Professional ---                       K21.0, Gastro-esophageal reflux disease with esophagitis                       K29.70,  Gastritis, unspecified, without bleeding                       R10.32, Left lower quadrant pain CPT copyright 2016 American Medical Association. All rights reserved. The codes documented in this report are preliminary and upon coder review may  be revised to meet current compliance requirements. Wallace CullensPaul Y Yocelin Vanlue, MD 06/13/2015 9:10:28 AM This report has been signed electronically. Number of Addenda: 0 Note Initiated On: 06/13/2015 8:57 AM      [facility]

## 2015-06-14 LAB — SURGICAL PATHOLOGY

## 2015-06-15 ENCOUNTER — Encounter: Payer: Self-pay | Admitting: Gastroenterology

## 2015-07-04 ENCOUNTER — Other Ambulatory Visit: Payer: Self-pay | Admitting: Internal Medicine

## 2015-07-04 DIAGNOSIS — K2930 Chronic superficial gastritis without bleeding: Secondary | ICD-10-CM | POA: Insufficient documentation

## 2015-07-04 DIAGNOSIS — Z1231 Encounter for screening mammogram for malignant neoplasm of breast: Secondary | ICD-10-CM

## 2015-07-17 ENCOUNTER — Ambulatory Visit: Payer: Medicare Other

## 2015-07-17 ENCOUNTER — Ambulatory Visit
Admission: RE | Admit: 2015-07-17 | Discharge: 2015-07-17 | Disposition: A | Discharge status: Home / Self Care | Payer: Medicare Other | Source: Ambulatory Visit | Attending: Internal Medicine | Admitting: Internal Medicine

## 2015-07-17 DIAGNOSIS — Z1231 Encounter for screening mammogram for malignant neoplasm of breast: Secondary | ICD-10-CM | POA: Diagnosis present

## 2015-07-31 ENCOUNTER — Emergency Department
Admission: EM | Admit: 2015-07-31 | Discharge: 2015-07-31 | Disposition: A | Discharge status: Home / Self Care | Payer: Medicare Other | Attending: Emergency Medicine | Admitting: Emergency Medicine

## 2015-07-31 ENCOUNTER — Encounter: Payer: Self-pay | Admitting: Emergency Medicine

## 2015-07-31 ENCOUNTER — Emergency Department: Payer: Medicare Other

## 2015-07-31 DIAGNOSIS — K8051 Calculus of bile duct without cholangitis or cholecystitis with obstruction: Secondary | ICD-10-CM | POA: Insufficient documentation

## 2015-07-31 DIAGNOSIS — R109 Unspecified abdominal pain: Secondary | ICD-10-CM | POA: Diagnosis present

## 2015-07-31 DIAGNOSIS — R1011 Right upper quadrant pain: Secondary | ICD-10-CM

## 2015-07-31 DIAGNOSIS — I10 Essential (primary) hypertension: Secondary | ICD-10-CM | POA: Insufficient documentation

## 2015-07-31 DIAGNOSIS — Z79899 Other long term (current) drug therapy: Secondary | ICD-10-CM | POA: Diagnosis not present

## 2015-07-31 DIAGNOSIS — K8050 Calculus of bile duct without cholangitis or cholecystitis without obstruction: Secondary | ICD-10-CM

## 2015-07-31 DIAGNOSIS — E785 Hyperlipidemia, unspecified: Secondary | ICD-10-CM | POA: Diagnosis not present

## 2015-07-31 DIAGNOSIS — Z7982 Long term (current) use of aspirin: Secondary | ICD-10-CM | POA: Insufficient documentation

## 2015-07-31 LAB — URINALYSIS COMPLETE WITH MICROSCOPIC (ARMC ONLY)
Bacteria, UA: NONE SEEN
Bilirubin Urine: NEGATIVE
Glucose, UA: NEGATIVE mg/dL
Leukocytes, UA: NEGATIVE
Nitrite: NEGATIVE
PH: 6 (ref 5.0–8.0)
PROTEIN: 30 mg/dL — AB
Specific Gravity, Urine: 1.013 (ref 1.005–1.030)

## 2015-07-31 LAB — CBC
HCT: 42.9 % (ref 35.0–47.0)
HEMOGLOBIN: 14.8 g/dL (ref 12.0–16.0)
MCH: 31.7 pg (ref 26.0–34.0)
MCHC: 34.5 g/dL (ref 32.0–36.0)
MCV: 92.1 fL (ref 80.0–100.0)
Platelets: 187 10*3/uL (ref 150–440)
RBC: 4.66 MIL/uL (ref 3.80–5.20)
RDW: 14.4 % (ref 11.5–14.5)
WBC: 6.3 10*3/uL (ref 3.6–11.0)

## 2015-07-31 LAB — COMPREHENSIVE METABOLIC PANEL
ALT: 30 U/L (ref 14–54)
ANION GAP: 11 (ref 5–15)
AST: 53 U/L — ABNORMAL HIGH (ref 15–41)
Albumin: 4.5 g/dL (ref 3.5–5.0)
Alkaline Phosphatase: 111 U/L (ref 38–126)
BUN: 18 mg/dL (ref 6–20)
CALCIUM: 9.5 mg/dL (ref 8.9–10.3)
CHLORIDE: 107 mmol/L (ref 101–111)
CO2: 21 mmol/L — AB (ref 22–32)
Creatinine, Ser: 0.98 mg/dL (ref 0.44–1.00)
GFR calc non Af Amer: 53 mL/min — ABNORMAL LOW (ref >60)
Glucose, Bld: 97 mg/dL (ref 65–99)
POTASSIUM: 4 mmol/L (ref 3.5–5.1)
SODIUM: 139 mmol/L (ref 135–145)
Total Bilirubin: 0.9 mg/dL (ref 0.3–1.2)
Total Protein: 8.7 g/dL — ABNORMAL HIGH (ref 6.5–8.1)

## 2015-07-31 LAB — LIPASE, BLOOD: LIPASE: 31 U/L (ref 11–51)

## 2015-07-31 MED ORDER — SODIUM CHLORIDE 0.9 % IV BOLUS (SEPSIS)
500.0000 mL | Freq: Once | INTRAVENOUS | Status: AC
  Administered 2015-07-31: 500 mL via INTRAVENOUS

## 2015-07-31 MED ORDER — MORPHINE SULFATE (PF) 2 MG/ML IV SOLN
2.0000 mg | Freq: Once | INTRAVENOUS | Status: AC
  Administered 2015-07-31: 2 mg via INTRAVENOUS
  Filled 2015-07-31: qty 1

## 2015-07-31 MED ORDER — ONDANSETRON HCL 4 MG PO TABS: 4.0000 mg | ORAL_TABLET | Freq: Three times a day (TID) | ORAL | Status: DC | PRN

## 2015-07-31 MED ORDER — HYDROCODONE-ACETAMINOPHEN 5-325 MG PO TABS: 1.0000 | ORAL_TABLET | Freq: Four times a day (QID) | ORAL | Status: DC | PRN

## 2015-07-31 MED ORDER — ONDANSETRON HCL 4 MG/2ML IJ SOLN
4.0000 mg | Freq: Once | INTRAMUSCULAR | Status: AC
  Administered 2015-07-31: 4 mg via INTRAVENOUS
  Filled 2015-07-31: qty 2

## 2015-07-31 NOTE — Discharge Instructions (Signed)
You were evaluated for abdominal pain, and I suspect gallstones are causing your symptoms. Your exam and evaluation are otherwise reassuring.  Return to emergency department for any worsening condition including vomiting blood, fever, worsening abdominal pain, black or bloody stools, dizziness or passing out, chest pain, trouble breathing, or any other symptoms concerning to you.  I am recommending that he follow up with primary care physician, and I have referred also to follow-up with a surgeon to consider nonemergency gallbladder evaluation.   Biliary Colic Biliary colic is a pain in the upper abdomen. The pain:  Is usually felt on the right side of the abdomen, but it may also be felt in the center of the abdomen, just below the breastbone (sternum).  May spread back toward the right shoulder blade.  May be steady or irregular.  May be accompanied by nausea and vomiting. Most of the time, the pain goes away in 1-5 hours. After the most intense pain passes, the abdomen may continue to ache mildly for about 24 hours. Biliary colic is caused by a blockage in the bile duct. The bile duct is a pathway that carries bile--a liquid that helps to digest fats--from the gallbladder to the small intestine. Biliary colic usually occurs after eating, when the digestive system demands bile. The pain develops when muscle cells contract forcefully to try to move the blockage so that bile can get by. HOME CARE INSTRUCTIONS  Take medicines only as directed by your health care provider.  Drink enough fluid to keep your urine clear or pale yellow.  Avoid fatty, greasy, and fried foods. These kinds of foods increase your body's demand for bile.  Avoid any foods that make your pain worse.  Avoid overeating.  Avoid having a large meal after fasting. SEEK MEDICAL CARE IF:  You develop a fever.  Your pain gets worse.  You vomit.  You develop nausea that prevents you from eating and drinking. SEEK  IMMEDIATE MEDICAL CARE IF:  You suddenly develop a fever and shaking chills.  You develop a yellowish discoloration (jaundice) of:  Skin.  Whites of the eyes.  Mucous membranes.  You have continuous or severe pain that is not relieved with medicines.  You have nausea and vomiting that is not relieved with medicines.  You develop dizziness or you faint.   This information is not intended to replace advice given to you by your health care provider. Make sure you discuss any questions you have with your health care provider.   Document Released: 06/22/2005 Document Revised: 06/05/2014 Document Reviewed: 10/31/2013 Elsevier Interactive Patient Education [employer]2016 Elsevier Inc.

## 2015-07-31 NOTE — ED Notes (Signed)
Patient transported to Ultrasound 

## 2015-07-31 NOTE — ED Provider Notes (Signed)
[facility] Emergency Department Provider Note   ____________________________________________  Time seen: Approximately 4pm I have reviewed the triage vital signs and the triage nursing note.  HISTORY  Chief Complaint Abdominal Pain   Historian Patient  HPI Anita Horne is a 82 y.o. female with a history of prior diverticulitis, and gallstones, is here for evaluation of abdominal pain, nausea and vomiting for a sounds like about one day. [facility] and was referred to the emergency department for further evaluation.  Pain is located in the right side of the abdomen more so than anywhere else. Denies urinary symptoms. Pain at its worst was considered moderate to severe, but at present is mild, 3 out of 10 on a pain scale.  Pain started after eating a cookie.    Past Medical History  Diagnosis Date  . Hypertension   . Hyperlipidemia   . Leukopenia   . Complication of anesthesia   . PONV (postoperative nausea and vomiting)     There are no active problems to display for this patient.   Past Surgical History  Procedure Laterality Date  . Breast biopsy Bilateral     1950's-neg  . Breast biopsy Left     bx/clip-neg  . Esophagogastroduodenoscopy (egd) with propofol N/A 06/13/2015    Procedure: ESOPHAGOGASTRODUODENOSCOPY (EGD) WITH PROPOFOL;  Surgeon: Wallace CullensPaul Y Oh, MD;  [facility];  Service: Gastroenterology;  Laterality: N/A;    Current Outpatient Rx  Name  Route  Sig  Dispense  Refill  . aspirin 325 MG tablet   Oral   Take 325 mg by mouth daily.         Marland Kitchen. azelastine (ASTELIN) 0.1 % nasal spray   Each Nare   Place into both nostrils 2 (two) times daily. Use in each nostril as directed         . calcium carbonate (OSCAL) 1500 (600 Ca) MG TABS tablet   Oral   Take by mouth 2 (two) times daily with a meal.         . HYDROcodone-acetaminophen (NORCO/VICODIN) 5-325 MG tablet   Oral   Take 1 tablet by mouth  every 6 (six) hours as needed for moderate pain.   5 tablet   0   . Magnesium 250 MG TABS   Oral   Take by mouth.         . Multiple Vitamins-Minerals (MULTIVITAMIN WITH MINERALS) tablet   Oral   Take 1 tablet by mouth daily.         Marland Kitchen. omega-3 acid ethyl esters (LOVAZA) 1 g capsule   Oral   Take by mouth 2 (two) times daily.         . ondansetron (ZOFRAN) 4 MG tablet   Oral   Take 1 tablet (4 mg total) by mouth every 8 (eight) hours as needed for nausea or vomiting.   10 tablet   0     Allergies Citrus  Family History  Problem Relation Age of Onset  . Breast cancer Sister     Social History Social History  Substance Use Topics  . Smoking status: Never Smoker   . Smokeless tobacco: Never Used  . Alcohol Use: No    Review of Systems  Constitutional: Negative for fever. Eyes: Negative for visual changes. ENT: Negative for sore throat. Cardiovascular: Negative for chest pain. Respiratory: Negative for shortness of breath. Gastrointestinal: Negative for abdominal pain, vomiting and diarrhea. Genitourinary: Negative for dysuria. Musculoskeletal: Negative for back  pain. Skin: Negative for rash. Neurological: Negative for headache. 10 point Review of Systems otherwise negative ____________________________________________   PHYSICAL EXAM:  VITAL SIGNS: ED Triage Vitals  Enc Vitals Group     BP 07/31/15 1504 148/86 mmHg     Pulse Rate 07/31/15 1504 82     Resp 07/31/15 1504 18     Temp 07/31/15 1504 98.1 F (36.7 C)     Temp Source 07/31/15 1504 Oral     SpO2 07/31/15 1504 98 %     Weight 07/31/15 1504 197 lb (89.359 kg)     Height 07/31/15 1504 5\' 4"  (1.626 m)     Head Cir --      Peak Flow --      Pain Score 07/31/15 1505 6     Pain Loc --      Pain Edu? --      Excl. in GC? --      Constitutional: Alert and oriented. Well appearing and in no distress. HEENT   Head: Normocephalic and atraumatic.      Eyes: Conjunctivae are normal.  PERRL. Normal extraocular movements.      Ears:         Nose: No congestion/rhinnorhea.   Mouth/Throat: Mucous membranes are moist.   Neck: No stridor. Cardiovascular/Chest: Normal rate, regular rhythm.  No murmurs, rubs, or gallops. Respiratory: Normal respiratory effort without tachypnea nor retractions. Breath sounds are clear and equal bilaterally. No wheezes/rales/rhonchi. Gastrointestinal: Soft. No distention, no guarding, no rebound. Moderate right sided and upper abdominal pain.  Genitourinary/rectal:Deferred Musculoskeletal: Nontender with normal range of motion in all extremities. No joint effusions.  No lower extremity tenderness.  No edema. Neurologic:  Normal speech and language. No gross or focal neurologic deficits are appreciated. Skin:  Skin is warm, dry and intact. No rash noted. Psychiatric: Mood and affect are normal. Speech and behavior are normal. Patient exhibits appropriate insight and judgment.  ____________________________________________   EKG I, Governor Rooksebecca Melanie Pellot, MD, the attending physician have personally viewed and interpreted all ECGs.  89 bpm. Atrial fibrillation. Right bundle branch block. Normal axis. Nonspecific ST and T-wave ____________________________________________  LABS (pertinent positives/negatives)  Labs Reviewed  COMPREHENSIVE METABOLIC PANEL - Abnormal; Notable for the following:    CO2 21 (*)    Total Protein 8.7 (*)    AST 53 (*)    GFR calc non Af Amer 53 (*)    All other components within normal limits  URINALYSIS COMPLETEWITH MICROSCOPIC ([facility] ONLY) - Abnormal; Notable for the following:    Color, Urine YELLOW (*)    APPearance CLEAR (*)    Ketones, ur TRACE (*)    Hgb urine dipstick 1+ (*)    Protein, ur 30 (*)    Squamous Epithelial / LPF 0-5 (*)    All other components within normal limits  LIPASE, BLOOD  CBC  LACTIC ACID, PLASMA  LACTIC ACID, PLASMA    ____________________________________________  RADIOLOGY All  Xrays were viewed by me. Imaging interpreted by Radiologist.  Right upper quadrant ultrasound: Cholelithiasis. No signs of acute cholecystitis __________________________________________  PROCEDURES  Procedure(s) performed: None  Critical Care performed: None  ____________________________________________   ED COURSE / ASSESSMENT AND PLAN  Pertinent labs & imaging results that were available during my care of the patient were reviewed by me and considered in my medical decision making (see chart for details).   Here with mostly right upper abd pain and a history of gallstones.  Labs/ultrasound without evidence of gb emergencies of  obstruction or infection.  I am not suspicious of cardiac etiology of upper abd pain.  I am not suspicious of infection such as diverticulitis with no elevated wbc, and location with similar symptoms to biliary colic previously.  I am not suspicious for other intestinal emergency such as vascular.  No diarrhea or bloody stools.  I discussed at this point discharging patient home for outpatient follow up with primary physician and surgeon.        CONSULTATIONS:  None   Patient / Family / Caregiver informed of clinical course, medical decision-making process, and agree with plan.   I discussed return precautions, follow-up instructions, and discharged instructions with patient and/or family.   ___________________________________________   FINAL CLINICAL IMPRESSION(S) / ED DIAGNOSES   Final diagnoses:  Biliary colic              Note: This dictation was prepared with Dragon dictation. Any transcriptional errors that result from this process are unintentional   Governor Rooks, MD 07/31/15 2054

## 2015-07-31 NOTE — ED Notes (Signed)
Pt ambulatory to toilet with this RN assist.

## 2015-07-31 NOTE — ED Notes (Signed)
Dr. Lord at bedside.  

## 2015-07-31 NOTE — ED Notes (Signed)
[facility] sent [facility] eval of abd pain. Pt with hx of gallstones.

## 2015-08-07 ENCOUNTER — Other Ambulatory Visit: Payer: Self-pay

## 2015-08-07 DIAGNOSIS — E785 Hyperlipidemia, unspecified: Secondary | ICD-10-CM | POA: Insufficient documentation

## 2015-08-07 DIAGNOSIS — G8929 Other chronic pain: Secondary | ICD-10-CM | POA: Insufficient documentation

## 2015-08-07 DIAGNOSIS — G479 Sleep disorder, unspecified: Secondary | ICD-10-CM | POA: Insufficient documentation

## 2015-08-07 DIAGNOSIS — M545 Low back pain, unspecified: Secondary | ICD-10-CM | POA: Insufficient documentation

## 2015-08-07 DIAGNOSIS — I4891 Unspecified atrial fibrillation: Secondary | ICD-10-CM | POA: Insufficient documentation

## 2015-08-07 DIAGNOSIS — D72819 Decreased white blood cell count, unspecified: Secondary | ICD-10-CM | POA: Insufficient documentation

## 2015-08-08 ENCOUNTER — Encounter: Payer: Self-pay | Admitting: Surgery

## 2015-08-08 ENCOUNTER — Ambulatory Visit (INDEPENDENT_AMBULATORY_CARE_PROVIDER_SITE_OTHER): Payer: Medicare Other | Admitting: Surgery

## 2015-08-08 VITALS — BP 133/85 | HR 101 | Temp 98.6°F | Ht 60.0 in | Wt 194.5 lb

## 2015-08-08 DIAGNOSIS — K8020 Calculus of gallbladder without cholecystitis without obstruction: Secondary | ICD-10-CM | POA: Diagnosis not present

## 2015-08-08 NOTE — Patient Instructions (Signed)
You have requested to have your Gallbladder removed. We will arrange this to be done on 08/22/15 at [facility] with Dr.Diego Pabon.  You will be off from work for approximately 1-2 weeks depending on your recovery.   Please avoid greasy and fried foods if at all possible prior to your scheduled surgery to decrease symptoms until then.  Please see the [facility] pre-care form you have been given today.  If you have any questions or concerns please call our office.

## 2015-08-08 NOTE — Progress Notes (Signed)
Patient ID: Anita Horne Shovlin, female   DOB: 04/26/1933, 82 y.o.   MRN: [id number]  History of Present Illness Anita Horne Mayeda is a 82 y.o. female in consultation for biliary colic. 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 And recently had an episode of abdominal pain in the right upper quadrant after having and daughter cookie. She reports that her pain lasted for approximately 14 hours, was moderate to severe in intensity and was not radiated. This pain was told and located in the right upper quadrant associated with nausea. Patient had similar episodes related to heavy meals. Of note the patient had a history of previous enteritis and a CT enterography performed over a year ago showed no evidence of a small bowel tumor.Marland Kitchen. He lives on her own and she now is accompanied by her daughter. She is also a TEFL teacherJehovah's Witness and when I ask her about transfusion she was adamant to say that she will refuse any transfusion of any problems even if it's a life-threatening situation. She walks without assistance and denies any dyspnea or angina at this time. No fevers chills or evidence of biliary obstruction. Ultrasound personally reviewed there is evidence of gallstones normal common bile duct. LFTs are normal. She has a history of A. fib but is well controlled and she is not on any anticoagulation  Past Medical History Past Medical History  Diagnosis Date  . Hypertension   . Hyperlipidemia   . Leukopenia   . Complication of anesthesia   . PONV (postoperative nausea and vomiting)       Past Surgical History  Procedure Laterality Date  . Breast biopsy Bilateral     1950's-neg  . Breast biopsy Left     bx/clip-neg  . Esophagogastroduodenoscopy (egd) with propofol N/A 06/13/2015    Procedure: ESOPHAGOGASTRODUODENOSCOPY (EGD) WITH PROPOFOL;  Surgeon: Wallace CullensPaul Y Oh, MD;  [facility];  Service: Gastroenterology;  Laterality: N/A;    Allergies  Allergen Reactions  . Citrus   . Other  Nausea Only and Rash    Decongestants General anesthesia  . Oxycodone-Acetaminophen Rash  . Zofran [Ondansetron Hcl] Rash    Current Outpatient Prescriptions  Medication Sig Dispense Refill  . diltiazem (DILACOR XR) 120 MG 24 hr capsule Take by mouth.    Marland Kitchen. aspirin 325 MG tablet Take 325 mg by mouth daily.    Marland Kitchen. aspirin EC 325 MG tablet Take by mouth.    Marland Kitchen. azelastine (ASTELIN) 0.1 % nasal spray Place into both nostrils 2 (two) times daily. Use in each nostril as directed    . calcium carbonate (OSCAL) 1500 (600 Ca) MG TABS tablet Take by mouth 2 (two) times daily with a meal.    . CARTIA XT 120 MG 24 hr capsule     . HYDROcodone-acetaminophen (NORCO/VICODIN) 5-325 MG tablet     . Magnesium 250 MG TABS Take by mouth.    . Multiple Vitamin (MULTI-VITAMINS) TABS Take by mouth.    . Multiple Vitamins-Minerals (MULTIVITAMIN WITH MINERALS) tablet Take 1 tablet by mouth daily.    Marland Kitchen. omega-3 acid ethyl esters (LOVAZA) 1 g capsule Take by mouth 2 (two) times daily.    . Omega-3 Fatty Acids (FISH OIL) 1000 MG CAPS Take by mouth.    . ondansetron (ZOFRAN) 4 MG tablet Take 1 tablet (4 mg total) by mouth every 8 (eight) hours as needed for nausea or vomiting. (Patient not taking: Reported on 08/08/2015) 10 tablet 0  . pantoprazole (PROTONIX) 40 MG tablet  No current facility-administered medications for this visit.    Family History Family History  Problem Relation Age of Onset  . Breast cancer Sister       Social History Social History  Substance Use Topics  . Smoking status: Never Smoker   . Smokeless tobacco: Never Used  . Alcohol Use: No      ROS 10 pt ROS is negative  Physical Exam Blood pressure 133/85, pulse 101, temperature 98.6 Horne (37 C), temperature source Oral, height 5' (1.524 m), weight 88.225 kg (194 lb 8 oz).  CONSTITUTIONAL: NAD EYES: Pupils equal, round, and reactive to light, Sclera non-icteric. EARS, NOSE, MOUTH AND THROAT: The oropharynx is clear. Oral mucosa  is pink and moist. Hearing is intact to voice.  NECK: Trachea is midline, and there is no jugular venous distension. Thyroid is without palpable abnormalities. LYMPH NODES:  Lymph nodes in the neck are not enlarged. RESPIRATORY:  Lungs are clear, and breath sounds are equal bilaterally. Normal respiratory effort without pathologic use of accessory muscles. CARDIOVASCULAR: Heart is iregular without murmurs, gallops, or rubs. GI: The abdomen is  soft, nontender, and nondistended. There were no palpable masses. There was no hepatosplenomegaly. There were normal bowel sounds. MUSCULOSKELETAL:  Normal muscle strength and tone in all four extremities.    SKIN: Skin turgor is normal. There are no pathologic skin lesions.  NEUROLOGIC:  Motor and sensation is grossly normal.  Cranial nerves are grossly intact. PSYCH:  Alert and oriented to person, place and time. Affect is normal.  Data Reviewed I have personally reviewed the patient's imaging and medical records.    Assessment/Plan 82 year old all needle was weakness with symptomatic cholelithiasis. She is in reasonable health for an 82 year old. Discussed with her about the options of medical versus surgical intervention.  After a lengthy discussion about medical versus surgical management patient decided to go ahead and proceed with cholecystectomy The risks, benefits, complications, treatment options, and expected outcomes were discussed with the patient. The possibilities of bleeding, recurrent infection, finding a normal gallbladder, perforation of viscus organs, damage to surrounding structures, bile leak, abscess formation, needing a drain placed, the need for additional procedures, reaction to medication, pulmonary aspiration,  failure to diagnose a condition, the possible need to convert to an open procedure, and creating a complication requiring transfusion or operation were discussed with the patient. The patient and/or family concurred with the  proposed plan, giving informed consent.  We'll also ask anesthesia for a preoperative consultation and given her age and her Jehovah's witnesses status. Extensive counseling provided  Sterling Bigiego Georganne Siple, MD FACS  Shaleigh Laubscher Horne Kalep Full 08/08/2015, 3:48 PM

## 2015-08-09 ENCOUNTER — Telehealth: Payer: Self-pay | Admitting: Surgery

## 2015-08-09 NOTE — Telephone Encounter (Signed)
Patient has called back and was given her surgery information in previous note. All questions were answered and patient verbalized understanding.

## 2015-08-09 NOTE — Telephone Encounter (Signed)
I have called patient to go over information below. No answer. I have left a message on voicemail.   Pt advised of pre op date/time and sx date. Sx: 08/22/15 with Dr Pabon--Laparoscopic cholecystectomy.  Pre op: 08/12/15 at 11:15am--Office.   Patient made aware to call [phone], between 1-3:00pm the day before surgery, to find out what time to arrive.

## 2015-08-12 ENCOUNTER — Telehealth: Payer: Self-pay

## 2015-08-12 ENCOUNTER — Encounter
Admission: RE | Admit: 2015-08-12 | Discharge: 2015-08-12 | Disposition: A | Discharge status: Home / Self Care | Payer: Medicare Other | Source: Ambulatory Visit | Attending: Surgery | Admitting: Surgery

## 2015-08-12 DIAGNOSIS — Z029 Encounter for administrative examinations, unspecified: Secondary | ICD-10-CM | POA: Insufficient documentation

## 2015-08-12 HISTORY — DX: Unspecified osteoarthritis, unspecified site: M19.90

## 2015-08-12 HISTORY — DX: Cardiac arrhythmia, unspecified: I49.9

## 2015-08-12 NOTE — Telephone Encounter (Signed)
Patient called about antibiotics. Patient is scheduled for a cholecystectomy on 08/22/2015. She wanted to know if the antibiotics would be given through a prescription or on the day of surgery. I informed her that the antibiotics will be given to her on the day of her surgery through an IV. Patient understood.

## 2015-08-12 NOTE — Patient Instructions (Signed)
  Your procedure is scheduled on:08/22/15 Thurs. Report to Day Surgery. To find out your arrival time please call [phone] between 1PM - 3PM on Wed. 08/21/15.  Remember: Instructions that are not followed completely may result in serious medical risk, up to and including death, or upon the discretion of your surgeon and anesthesiologist your surgery may need to be rescheduled.    __x__ 1. Do not eat food or drink liquids after midnight. No gum chewing or hard candies.     __x__ 2. No Alcohol for 24 hours before or after surgery.   ____ 3. Do Not Smoke For 24 Hours Prior to Your Surgery.   ____ 4. Bring all medications with you on the day of surgery if instructed.    __x__ 5. Notify your doctor if there is any change in your medical condition     (cold, fever, infections).       Do not wear jewelry, make-up, hairpins, clips or nail polish.  Do not wear lotions, powders, or perfumes. You may wear deodorant.  Do not shave 48 hours prior to surgery. Men may shave face and neck.  Do not bring valuables to the hospital.    [facility] is not responsible for any belongings or valuables.               Contacts, dentures or bridgework may not be worn into surgery.  Leave your suitcase in the car. After surgery it may be brought to your room.  For patients admitted to the hospital, discharge time is determined by your                treatment team.   Patients discharged the day of surgery will not be allowed to drive home.   Please read over the following fact sheets that you were given:   Surgical Site Infection Prevention   ____ Take these medicines the morning of surgery with A SIP OF WATER:    1. azelastine (ASTELIN) 0.1 % nasal spray if needed                                                                                       2. pantoprazole (PROTONIX) 40 MG tablet the night before surgery and the morning of surgery  3. CARTIA XT 120 MG 24 hr  capsule  4.  5.  6.  ____ Fleet Enema (as directed)   __x__ Use CHG Soap as directed  ____ Use inhalers on the day of surgery  ____ Stop metformin 2 days prior to surgery    ____ Take 1/2 of usual insulin dose the night before surgery and none on the morning of surgery.   __x__ Stop aspirin on 08/17/15 Saturday  ____ Stop Anti-inflammatories on    __x__ Stop supplements until after surgery.   Lovaza/ Omega3 acid ethyl estersl 08/15/15 Thurs.  ____ Bring C-Pap to the hospital.

## 2015-08-12 NOTE — Pre-Procedure Instructions (Signed)
Echocardiogram 2D complete4/08/2015  [facility]  Component Name Value Range  LV Ejection Fraction (%) 50   Aortic Valve Stenosis Grade none   Aortic Valve Regurgitation Grade trivial   Mitral Valve Stenosis Grade none   Mitral Valve Regurgitation Grade trivial   Tricuspid Valve Regurgitation Grade mild   Tricuspid Valve Regurgitation Max Velocity (m/s) 2.9 m/sec   Right Ventricle Systolic Pressure (mmHg) 39.6 mmHg   LV End Diastolic Diameter (cm) 4 cm  LV End Systolic Diameter (cm) 1.9 cm  LV Septum Wall Thickness (cm) 1.1 cm  LV Posterior Wall Thickness (cm) 0.9 cm  Left Atrium Diameter (cm) 5.8 cm   Result Narrative                  INTERNAL MEDICINE DEPARTMENT                     [facility]                           [id number]                   [facility]                       Acct #: [id number]         [address] [place] Horne,  [place] [address]            Date: 05/10/2015 01:07 PM                                                                  Adult Female Age: 82 yrs                     ECHOCARDIOGRAM REPORT                        Outpatient          STUDY:CHEST WALL         TAPE:0000:00: 0:00:00          KC::KCWI           ECHO:Yes   DOPPLER:Yes  FILE:[id number]              MD1:  FLEMING, HERBON E          COLOR:Yes  CONTRAST:No       MACHINE:Philips            Height: 63 in      RV BIOPSY:No         3D:No    SOUND QLTY:Moderate           Weight: 197 lb         MEDIUM:None  BSA: 1.9 m2 ___________________________________________________________________________________________    HISTORY:DOE     REASON:Assess, LV function INDICATION:DOE (dyspnea on exertion) [R06.09 (ICD-10-CM)], Nocturnal oxygen            desaturation [G47.34  (ICD-10-CM)];  ___________________________________________________________________________________________ ECHOCARDIOGRAPHIC MEASUREMENTS 2D DIMENSIONS AORTA             Values      Normal Range      MAIN PA          Values      Normal Range           Annulus:  nm*       [2.1 - 2.5]                PA Main:  nm*       [1.5 - 2.1]         Aorta Sin:  nm*       [2.7 - 3.3]       RIGHT VENTRICLE       ST Junction:  nm*       [2.3 - 2.9]                RV Base:  nm*       [ < 4.2]         Asc.Aorta:  nm*       [2.3 - 3.1]                 RV Mid:  nm*       [ < 3.5]  LEFT VENTRICLE                                        RV Length:  nm*       [ < 8.6]             LVIDd:  4.0 cm    [3.9 - 5.3]       INFERIOR VENA CAVA             LVIDs:  1.9 cm                              Max. IVC:  nm*       [ <= 2.1]                FS:  52.5 %    [> 25]                    Min. IVC:  nm*               SWT:  1.1 cm    [0.5 - 0.9]                   ------------------               PWT:  0.90 cm   [0.5 - 0.9]                   nm* - not measured  LEFT ATRIUM           LA Diam:  5.8 cm    [2.7 - 3.8]       LA A4C Area:  nm*       [ < 20]         LA Volume:  nm*       [  22 - 52]  ___________________________________________________________________________________________ ECHOCARDIOGRAPHIC DESCRIPTIONS  AORTIC ROOT         Size:Normal   Dissection:INDETERM FOR DISSECTION  AORTIC VALVE     Leaflets:Tricuspid            Morphology:MILDLY THICKENED     Mobility:Fully mobile  LEFT VENTRICLE         Size:Normal                 Anterior:Normal  Contraction:Normal                  Lateral:Normal   Closest EF:50% (Estimated)          Septal:Normal    LV Masses:No Masses                Apical:Normal          RUE:AVWU                   Inferior:Normal                                    Posterior:Normal Dias.FxClass:can't be determined  MITRAL VALVE     Leaflets:Normal                 Mobility:Fully mobile    Morphology:ANNULAR CALC  LEFT ATRIUM         Size:MODERATELY ENLARGED   LA Masses:No masses    IA Septum:Normal IAS  MAIN PA         Size:Normal  PULMONIC VALVE   Morphology:Normal                 Mobility:Fully mobile  RIGHT VENTRICLE    RV Masses:No Masses                  Size:MILDLY ENLARGED    Free Wall:Normal              Contraction:MILD GLOBAL DECREASE  TRICUSPID VALVE     Leaflets:Normal                 Mobility:Fully mobile   Morphology:Normal  RIGHT ATRIUM         Size:Normal                 RA Other:None      RA Mass:No masses  PERICARDIUM        Fluid:No effusion  INFERIOR VENACAVA         Size:Normal Normal respiratory collapse   ________________________________________________________________ DOPPLER ECHO and OTHER SPECIAL PROCEDURES    Aortic:TRIVIAL AR                 No AS     Mitral:TRIVIAL MR                 No MS           MV Inflow E Vel=nm*        MV Annulus E'Vel=nm*           E/E'Ratio=nm*  Tricuspid:MILD TR                    No TS           294.0 cm/sec peak TR vel   39.6 mmHg peak RV pressure  Pulmonary:TRIVIAL PR                 No PS  ___________________________________________________________________________________________ INTERPRETATION NORMAL LEFT VENTRICULAR SYSTOLIC FUNCTION NORMAL RIGHT VENTRICULAR SYSTOLIC FUNCTION MILD VALVULAR REGURGITATION (See above) NO VALVULAR STENOSIS Mild elevation in pulmonary pressures   ___________________________________________________________________________________________ Electronically signed by: Danella Penton, MD on 05/10/2015 05:39 PM             Performed By: Rayetta Humphrey, RCS       Ordering Physician: Ned Clines E  ___________________________________________________________________________________________

## 2015-08-12 NOTE — Pre-Procedure Instructions (Signed)
Request for cardiac clearancee faxed to Dr. Altamese DillingB. Kline.  Pt. has no cardiologist. Recently seen in ED.  EKG in progress

## 2015-08-14 ENCOUNTER — Telehealth: Payer: Self-pay

## 2015-08-14 NOTE — Telephone Encounter (Signed)
Crystal (daughter) called in regards to mothers FMLA paperwork. I gave her the [place] phone number to get [facility].

## 2015-08-15 NOTE — Telephone Encounter (Signed)
Called Chrystal and did not respond. I left a voicemail to call me back.

## 2015-08-16 NOTE — Telephone Encounter (Signed)
Anita Horne called me back. She wanted to know if I had received her FMLA Form. I told her that I did but that I will fill out after her mother had her surgery. Anita Horne understood.

## 2015-08-16 NOTE — Telephone Encounter (Signed)
Error

## 2015-08-16 NOTE — Telephone Encounter (Signed)
error 

## 2015-08-20 NOTE — Pre-Procedure Instructions (Signed)
CLEARED BY DR B KLEIN LOW TO MOD RISK WITH EKG UNCHANDED FROM 2015. CLEARED 08/20/15

## 2015-08-22 ENCOUNTER — Encounter
Admission: RE | Disposition: A | Discharge status: Home / Self Care | Payer: Self-pay | Source: Ambulatory Visit | Attending: Surgery

## 2015-08-22 ENCOUNTER — Ambulatory Visit
Admission: RE | Admit: 2015-08-22 | Discharge: 2015-08-22 | Disposition: A | Discharge status: Home / Self Care | Payer: Medicare Other | Source: Ambulatory Visit | Attending: Surgery | Admitting: Surgery

## 2015-08-22 ENCOUNTER — Ambulatory Visit: Payer: Medicare Other | Admitting: Registered Nurse

## 2015-08-22 ENCOUNTER — Encounter: Payer: Self-pay | Admitting: Anesthesiology

## 2015-08-22 DIAGNOSIS — Z7982 Long term (current) use of aspirin: Secondary | ICD-10-CM | POA: Diagnosis not present

## 2015-08-22 DIAGNOSIS — K8020 Calculus of gallbladder without cholecystitis without obstruction: Secondary | ICD-10-CM | POA: Diagnosis not present

## 2015-08-22 DIAGNOSIS — M1990 Unspecified osteoarthritis, unspecified site: Secondary | ICD-10-CM | POA: Insufficient documentation

## 2015-08-22 DIAGNOSIS — Z885 Allergy status to narcotic agent status: Secondary | ICD-10-CM | POA: Diagnosis not present

## 2015-08-22 DIAGNOSIS — I10 Essential (primary) hypertension: Secondary | ICD-10-CM | POA: Insufficient documentation

## 2015-08-22 DIAGNOSIS — E785 Hyperlipidemia, unspecified: Secondary | ICD-10-CM | POA: Diagnosis not present

## 2015-08-22 DIAGNOSIS — K8010 Calculus of gallbladder with chronic cholecystitis without obstruction: Secondary | ICD-10-CM | POA: Insufficient documentation

## 2015-08-22 DIAGNOSIS — K828 Other specified diseases of gallbladder: Secondary | ICD-10-CM | POA: Insufficient documentation

## 2015-08-22 DIAGNOSIS — Z79899 Other long term (current) drug therapy: Secondary | ICD-10-CM | POA: Diagnosis not present

## 2015-08-22 HISTORY — PX: CHOLECYSTECTOMY: SHX55

## 2015-08-22 SURGERY — LAPAROSCOPIC CHOLECYSTECTOMY
Anesthesia: General

## 2015-08-22 MED ORDER — PROPOFOL 10 MG/ML IV BOLUS
INTRAVENOUS | Status: DC | PRN
  Administered 2015-08-22: 160 mg via INTRAVENOUS
  Administered 2015-08-22: 20 mg via INTRAVENOUS

## 2015-08-22 MED ORDER — FENTANYL CITRATE (PF) 100 MCG/2ML IJ SOLN
25.0000 ug | INTRAMUSCULAR | Status: DC | PRN
  Administered 2015-08-22 (×2): 25 ug via INTRAVENOUS

## 2015-08-22 MED ORDER — DIPHENHYDRAMINE HCL 50 MG/ML IJ SOLN
INTRAMUSCULAR | Status: DC | PRN
  Administered 2015-08-22: 6.25 mg via INTRAVENOUS

## 2015-08-22 MED ORDER — LACTATED RINGERS IV SOLN
INTRAVENOUS | Status: DC
  Administered 2015-08-22: 12:00:00 via INTRAVENOUS

## 2015-08-22 MED ORDER — CHLORHEXIDINE GLUCONATE CLOTH 2 % EX PADS: 6.0000 | MEDICATED_PAD | Freq: Once | CUTANEOUS | Status: DC

## 2015-08-22 MED ORDER — PHENYLEPHRINE HCL 10 MG/ML IJ SOLN
INTRAMUSCULAR | Status: DC | PRN
  Administered 2015-08-22 (×3): 50 ug via INTRAVENOUS
  Administered 2015-08-22: 200 ug via INTRAVENOUS

## 2015-08-22 MED ORDER — BUPIVACAINE-EPINEPHRINE 0.25% -1:200000 IJ SOLN
INTRAMUSCULAR | Status: DC | PRN
  Administered 2015-08-22: 30 mL

## 2015-08-22 MED ORDER — CEFAZOLIN SODIUM-DEXTROSE 2-4 GM/100ML-% IV SOLN
2.0000 g | INTRAVENOUS | Status: AC
  Administered 2015-08-22: 2 g via INTRAVENOUS

## 2015-08-22 MED ORDER — GLYCOPYRROLATE 0.2 MG/ML IJ SOLN
INTRAMUSCULAR | Status: DC | PRN
  Administered 2015-08-22: 0.6 mg via INTRAVENOUS

## 2015-08-22 MED ORDER — DEXAMETHASONE SODIUM PHOSPHATE 10 MG/ML IJ SOLN: INTRAMUSCULAR | Status: DC | PRN

## 2015-08-22 MED ORDER — ROCURONIUM BROMIDE 100 MG/10ML IV SOLN
INTRAVENOUS | Status: DC | PRN
  Administered 2015-08-22: 30 mg via INTRAVENOUS

## 2015-08-22 MED ORDER — FENTANYL CITRATE (PF) 100 MCG/2ML IJ SOLN
INTRAMUSCULAR | Status: DC | PRN
  Administered 2015-08-22: 100 ug via INTRAVENOUS

## 2015-08-22 MED ORDER — BUPIVACAINE-EPINEPHRINE (PF) 0.25% -1:200000 IJ SOLN
INTRAMUSCULAR | Status: AC
  Filled 2015-08-22: qty 30

## 2015-08-22 MED ORDER — FENTANYL CITRATE (PF) 100 MCG/2ML IJ SOLN
INTRAMUSCULAR | Status: AC
  Administered 2015-08-22: 25 ug via INTRAVENOUS
  Filled 2015-08-22: qty 2

## 2015-08-22 MED ORDER — NEOSTIGMINE METHYLSULFATE 10 MG/10ML IV SOLN
INTRAVENOUS | Status: DC | PRN
  Administered 2015-08-22: 4 mg via INTRAVENOUS

## 2015-08-22 MED ORDER — HYDROCODONE-ACETAMINOPHEN 5-325 MG PO TABS
1.0000 | ORAL_TABLET | Freq: Four times a day (QID) | ORAL | Status: DC | PRN
Start: 2015-08-22 — End: 2021-02-16

## 2015-08-22 MED ORDER — LACTATED RINGERS IV SOLN
INTRAVENOUS | Status: DC | PRN
  Administered 2015-08-22: 12:00:00 via INTRAVENOUS

## 2015-08-22 MED ORDER — DEXAMETHASONE SODIUM PHOSPHATE 10 MG/ML IJ SOLN
INTRAMUSCULAR | Status: DC | PRN
  Administered 2015-08-22: 4 mg via INTRAVENOUS

## 2015-08-22 MED ORDER — LIDOCAINE HCL (CARDIAC) 20 MG/ML IV SOLN
INTRAVENOUS | Status: DC | PRN
  Administered 2015-08-22: 100 mg via INTRAVENOUS

## 2015-08-22 MED ORDER — CEFAZOLIN SODIUM-DEXTROSE 2-4 GM/100ML-% IV SOLN
INTRAVENOUS | Status: AC
  Administered 2015-08-22: 2 g via INTRAVENOUS
  Filled 2015-08-22: qty 100

## 2015-08-22 SURGICAL SUPPLY — 47 items
APPLICATOR COTTON TIP 6IN STRL (MISCELLANEOUS) ×3 IMPLANT
APPLIER CLIP 5 13 M/L LIGAMAX5 (MISCELLANEOUS) ×3
BLADE SURG 15 STRL LF DISP TIS (BLADE) ×1 IMPLANT
BLADE SURG 15 STRL SS (BLADE) ×2
CANISTER SUCT 1200ML W/VALVE (MISCELLANEOUS) ×3 IMPLANT
CHLORAPREP W/TINT 26ML (MISCELLANEOUS) ×3 IMPLANT
CHOLANGIOGRAM CATH TAUT (CATHETERS) IMPLANT
CLEANER CAUTERY TIP 5X5 PAD (MISCELLANEOUS) ×1 IMPLANT
CLIP APPLIE 5 13 M/L LIGAMAX5 (MISCELLANEOUS) ×1 IMPLANT
DECANTER SPIKE VIAL GLASS SM (MISCELLANEOUS) ×6 IMPLANT
DEVICE TROCAR PUNCTURE CLOSURE (ENDOMECHANICALS) IMPLANT
DRAPE C-ARM XRAY 36X54 (DRAPES) ×3 IMPLANT
ELECT REM PT RETURN 9FT ADLT (ELECTROSURGICAL) ×3
ELECTRODE REM PT RTRN 9FT ADLT (ELECTROSURGICAL) ×1 IMPLANT
ENDOPOUCH RETRIEVER 10 (MISCELLANEOUS) ×3 IMPLANT
GLOVE BIO SURGEON STRL SZ7 (GLOVE) ×3 IMPLANT
GOWN STRL REUS W/ TWL LRG LVL3 (GOWN DISPOSABLE) ×3 IMPLANT
GOWN STRL REUS W/TWL LRG LVL3 (GOWN DISPOSABLE) ×6
IRRIGATION STRYKERFLOW (MISCELLANEOUS) ×1 IMPLANT
IRRIGATOR STRYKERFLOW (MISCELLANEOUS) ×3
IV CATH ANGIO 12GX3 LT BLUE (NEEDLE) ×3 IMPLANT
IV SOD CHL 0.9% 1000ML (IV SOLUTION) ×3 IMPLANT
L-HOOK LAP DISP 36CM (ELECTROSURGICAL) ×3
LHOOK LAP DISP 36CM (ELECTROSURGICAL) ×1 IMPLANT
LIQUID BAND (GAUZE/BANDAGES/DRESSINGS) ×3 IMPLANT
NEEDLE HYPO 22GX1.5 SAFETY (NEEDLE) ×3 IMPLANT
PACK LAP CHOLECYSTECTOMY (MISCELLANEOUS) ×3 IMPLANT
PAD CLEANER CAUTERY TIP 5X5 (MISCELLANEOUS) ×2
PENCIL ELECTRO HAND CTR (MISCELLANEOUS) ×3 IMPLANT
SCISSORS METZENBAUM CVD 33 (INSTRUMENTS) ×3 IMPLANT
SLEEVE ENDOPATH XCEL 5M (ENDOMECHANICALS) ×6 IMPLANT
SOL ANTI-FOG 6CC FOG-OUT (MISCELLANEOUS) ×1 IMPLANT
SOL FOG-OUT ANTI-FOG 6CC (MISCELLANEOUS) ×2
SPONGE LAP 18X18 5 PK (GAUZE/BANDAGES/DRESSINGS) ×3 IMPLANT
STOPCOCK 3 WAY  REPLAC (MISCELLANEOUS) IMPLANT
SUT ETHIBOND 0 MO6 C/R (SUTURE) IMPLANT
SUT MNCRL 4-0 (SUTURE) ×2
SUT MNCRL 4-0 27XMFL (SUTURE) ×1
SUT MNCRL AB 4-0 PS2 18 (SUTURE) ×3 IMPLANT
SUT VIC AB 0 CT2 27 (SUTURE) IMPLANT
SUT VICRYL 0 AB UR-6 (SUTURE) ×6 IMPLANT
SUTURE MNCRL 4-0 27XMF (SUTURE) ×1 IMPLANT
SYR 20CC LL (SYRINGE) ×3 IMPLANT
TROCAR XCEL BLUNT TIP 100MML (ENDOMECHANICALS) ×3 IMPLANT
TROCAR XCEL NON-BLD 5MMX100MML (ENDOMECHANICALS) ×3 IMPLANT
TUBING INSUFFLATOR HI FLOW (MISCELLANEOUS) ×3 IMPLANT
WATER STERILE IRR 1000ML POUR (IV SOLUTION) ×3 IMPLANT

## 2015-08-22 NOTE — H&P (View-Only) (Signed)
Patient ID: Anita Horne, female   DOB: 04/26/1933, 82 y.o.   MRN: [id number]  History of Present Illness Anita Horne is a 82 y.o. female in consultation for biliary colic. 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 And recently had an episode of abdominal pain in the right upper quadrant after having and daughter cookie. She reports that her pain lasted for approximately 14 hours, was moderate to severe in intensity and was not radiated. This pain was told and located in the right upper quadrant associated with nausea. Patient had similar episodes related to heavy meals. Of note the patient had a history of previous enteritis and a CT enterography performed over a year ago showed no evidence of a small bowel tumor.Marland Kitchen. He lives on her own and she now is accompanied by her daughter. She is also a TEFL teacherJehovah's Witness and when I ask her about transfusion she was adamant to say that she will refuse any transfusion of any problems even if it's a life-threatening situation. She walks without assistance and denies any dyspnea or angina at this time. No fevers chills or evidence of biliary obstruction. Ultrasound personally reviewed there is evidence of gallstones normal common bile duct. LFTs are normal. She has a history of A. fib but is well controlled and she is not on any anticoagulation  Past Medical History Past Medical History  Diagnosis Date  . Hypertension   . Hyperlipidemia   . Leukopenia   . Complication of anesthesia   . PONV (postoperative nausea and vomiting)       Past Surgical History  Procedure Laterality Date  . Breast biopsy Bilateral     1950's-neg  . Breast biopsy Left     bx/clip-neg  . Esophagogastroduodenoscopy (egd) with propofol N/A 06/13/2015    Procedure: ESOPHAGOGASTRODUODENOSCOPY (EGD) WITH PROPOFOL;  Surgeon: Wallace CullensPaul Y Oh, MD;  [facility];  Service: Gastroenterology;  Laterality: N/A;    Allergies  Allergen Reactions  . Citrus   . Other  Nausea Only and Rash    Decongestants General anesthesia  . Oxycodone-Acetaminophen Rash  . Zofran [Ondansetron Hcl] Rash    Current Outpatient Prescriptions  Medication Sig Dispense Refill  . diltiazem (DILACOR XR) 120 MG 24 hr capsule Take by mouth.    Marland Kitchen. aspirin 325 MG tablet Take 325 mg by mouth daily.    Marland Kitchen. aspirin EC 325 MG tablet Take by mouth.    Marland Kitchen. azelastine (ASTELIN) 0.1 % nasal spray Place into both nostrils 2 (two) times daily. Use in each nostril as directed    . calcium carbonate (OSCAL) 1500 (600 Ca) MG TABS tablet Take by mouth 2 (two) times daily with a meal.    . CARTIA XT 120 MG 24 hr capsule     . HYDROcodone-acetaminophen (NORCO/VICODIN) 5-325 MG tablet     . Magnesium 250 MG TABS Take by mouth.    . Multiple Vitamin (MULTI-VITAMINS) TABS Take by mouth.    . Multiple Vitamins-Minerals (MULTIVITAMIN WITH MINERALS) tablet Take 1 tablet by mouth daily.    Marland Kitchen. omega-3 acid ethyl esters (LOVAZA) 1 g capsule Take by mouth 2 (two) times daily.    . Omega-3 Fatty Acids (FISH OIL) 1000 MG CAPS Take by mouth.    . ondansetron (ZOFRAN) 4 MG tablet Take 1 tablet (4 mg total) by mouth every 8 (eight) hours as needed for nausea or vomiting. (Patient not taking: Reported on 08/08/2015) 10 tablet 0  . pantoprazole (PROTONIX) 40 MG tablet  No current facility-administered medications for this visit.    Family History Family History  Problem Relation Age of Onset  . Breast cancer Sister       Social History Social History  Substance Use Topics  . Smoking status: Never Smoker   . Smokeless tobacco: Never Used  . Alcohol Use: No      ROS 10 pt ROS is negative  Physical Exam Blood pressure 133/85, pulse 101, temperature 98.6 F (37 C), temperature source Oral, height 5' (1.524 m), weight 88.225 kg (194 lb 8 oz).  CONSTITUTIONAL: NAD EYES: Pupils equal, round, and reactive to light, Sclera non-icteric. EARS, NOSE, MOUTH AND THROAT: The oropharynx is clear. Oral mucosa  is pink and moist. Hearing is intact to voice.  NECK: Trachea is midline, and there is no jugular venous distension. Thyroid is without palpable abnormalities. LYMPH NODES:  Lymph nodes in the neck are not enlarged. RESPIRATORY:  Lungs are clear, and breath sounds are equal bilaterally. Normal respiratory effort without pathologic use of accessory muscles. CARDIOVASCULAR: Heart is iregular without murmurs, gallops, or rubs. GI: The abdomen is  soft, nontender, and nondistended. There were no palpable masses. There was no hepatosplenomegaly. There were normal bowel sounds. MUSCULOSKELETAL:  Normal muscle strength and tone in all four extremities.    SKIN: Skin turgor is normal. There are no pathologic skin lesions.  NEUROLOGIC:  Motor and sensation is grossly normal.  Cranial nerves are grossly intact. PSYCH:  Alert and oriented to person, place and time. Affect is normal.  Data Reviewed I have personally reviewed the patient's imaging and medical records.    Assessment/Plan 82 year old all needle was weakness with symptomatic cholelithiasis. She is in reasonable health for an 82 year old. Discussed with her about the options of medical versus surgical intervention.  After a lengthy discussion about medical versus surgical management patient decided to go ahead and proceed with cholecystectomy The risks, benefits, complications, treatment options, and expected outcomes were discussed with the patient. The possibilities of bleeding, recurrent infection, finding a normal gallbladder, perforation of viscus organs, damage to surrounding structures, bile leak, abscess formation, needing a drain placed, the need for additional procedures, reaction to medication, pulmonary aspiration,  failure to diagnose a condition, the possible need to convert to an open procedure, and creating a complication requiring transfusion or operation were discussed with the patient. The patient and/or family concurred with the  proposed plan, giving informed consent.  We'll also ask anesthesia for a preoperative consultation and given her age and her Jehovah's witnesses status. Extensive counseling provided  Sterling Bigiego Anita Wyble, MD FACS  Anita Horne 08/08/2015, 3:48 PM

## 2015-08-22 NOTE — Interval H&P Note (Signed)
History and Physical Interval Note:  08/22/2015 10:26 AM  Esther HardyIrene F Horne  has presented today for surgery, with the diagnosis of cholelithiasis  The various methods of treatment have been discussed with the patient and family. After consideration of risks, benefits and other options for treatment, the patient has consented to  Procedure(s): LAPAROSCOPIC CHOLECYSTECTOMY (N/A) as a surgical intervention .  The patient's history has been reviewed, patient examined, no change in status, stable for surgery.  I have reviewed the patient's chart and labs.  Questions were answered to the patient's satisfaction.     Oneil Behney F Herold Salguero

## 2015-08-22 NOTE — Op Note (Signed)
Laparoscopic Cholecystectomy  Pre-operative Diagnosis: Symptomatic Cholelithiasis  Post-operative Diagnosis: same  Procedure: laparoscopic cholecystectomy  Surgeon: Sterling Bigiego Pabon, MD FACS  Anesthesia: Gen. with endotracheal tube  Findings: Chronic Cholecystitis  GB filled w GS  Estimated Blood Loss: 10cc     Drains: none         Specimens: Gallbladder           Complications: none   Procedure Details  The patient was seen again in the Holding Room. The benefits, complications, treatment options, and expected outcomes were discussed with the patient. The risks of bleeding, infection, recurrence of symptoms, failure to resolve symptoms, bile duct damage, bile duct leak, retained common bile duct stone, bowel injury, any of which could require further surgery and/or ERCP, stent, or papillotomy were reviewed with the patient. The likelihood of improving the patient's symptoms with return to their baseline status is good.  The patient and/or family concurred with the proposed plan, giving informed consent.  The patient was taken to Operating Room, identified as Esther Hardyrene F Fuchs and the procedure verified as Laparoscopic Cholecystectomy.  A Time Out was held and the above information confirmed.  Prior to the induction of general anesthesia, antibiotic prophylaxis was administered. VTE prophylaxis was in place. General endotracheal anesthesia was then administered and tolerated well. After the induction, the abdomen was prepped with Chloraprep and draped in the sterile fashion. The patient was positioned in the supine position.  Local anesthetic  was injected into the skin near the umbilicus and an incision made. Cut down technique was used to enter the abdominal cavity and a Hasson trochar was placed after two vicryl stitches were anchored to the fascia. Pneumoperitoneum was then created with CO2 and tolerated well without any adverse changes in the patient's vital signs.  Three 5-mm ports were  placed in the right upper quadrant all under direct vision. All skin incisions  were infiltrated with a local anesthetic agent before making the incision and placing the trocars.   The patient was positioned  in reverse Trendelenburg, tilted slightly to the patient's left.  The gallbladder was identified, the fundus grasped and retracted cephalad. Adhesions were lysed bluntly. The infundibulum was grasped and retracted laterally, exposing the peritoneum overlying the triangle of Calot. This was then divided and exposed in a blunt fashion. An extended critical view of the cystic duct and cystic artery was obtained.  The cystic duct was clearly identified and bluntly dissected.   Artery and duct were double clipped and divided.  The gallbladder was taken from the gallbladder fossa in a retrograde fashion with the electrocautery. The gallbladder was removed and placed in an Endocatch bag. The liver bed was irrigated and inspected. Hemostasis was achieved with the electrocautery. Copious irrigation was utilized and was repeatedly aspirated until clear.  The gallbladder and Endocatch sac were then removed through the epigastric port site.    Inspection of the right upper quadrant was performed. No bleeding, bile duct injury or leak, or bowel injury was noted. Pneumoperitoneum was released.  The periumbilical port site was closed with figure-of-eight 0 Vicryl sutures. 4-0 subcuticular Monocryl was used to close the skin. Dermabond was  applied.  The patient was then extubated and brought to the recovery room in stable condition. Sponge, lap, and needle counts were correct at closure and at the conclusion of the case.               Sterling Bigiego Pabon, MD, FACS

## 2015-08-22 NOTE — Anesthesia Procedure Notes (Signed)
Procedure Name: Intubation Date/Time: 08/22/2015 12:52 PM Performed by: Deri FuellingPRIVETTE, Rimsha Trembley Pre-anesthesia Checklist: Patient identified, Emergency Drugs available, Suction available, Patient being monitored and Timeout performed Oxygen Delivery Method: Circle system utilized Preoxygenation: Pre-oxygenation with 100% oxygen Intubation Type: IV induction Ventilation: Mask ventilation without difficulty Laryngoscope Size: Mac and 3 Grade View: Grade I Tube type: Oral Tube size: 7.0 mm Number of attempts: 1 Airway Equipment and Method: Stylet Placement Confirmation: ETT inserted through vocal cords under direct vision,  positive ETCO2,  CO2 detector and breath sounds checked- equal and bilateral Secured at: 21 cm Tube secured with: Tape Dental Injury: Teeth and Oropharynx as per pre-operative assessment

## 2015-08-22 NOTE — Transfer of Care (Signed)
Immediate Anesthesia Transfer of Care Note  Patient: Anita Horne  Procedure(s) Performed: Procedure(s): LAPAROSCOPIC CHOLECYSTECTOMY (N/A)  Patient Location: PACU  Anesthesia Type:General  Level of Consciousness: awake  Airway & Oxygen Therapy: Patient Spontanous Breathing  Post-op Assessment: Report given to RN  Post vital signs: Reviewed  Last Vitals:  Filed Vitals:   08/22/15 1129 08/22/15 1400  BP: 152/82 128/73  Pulse: 98 74  Temp: 36.7 C 36.4 C  Resp: 16 17    Last Pain: There were no vitals filed for this visit.       Complications: No apparent anesthesia complications

## 2015-08-22 NOTE — Anesthesia Postprocedure Evaluation (Signed)
Anesthesia Post Note  Patient: Anita Horne  Procedure(s) Performed: Procedure(s) (LRB): LAPAROSCOPIC CHOLECYSTECTOMY (N/A)  Patient location during evaluation: PACU Anesthesia Type: General Level of consciousness: awake and alert Pain management: pain level controlled Vital Signs Assessment: post-procedure vital signs reviewed and stable Respiratory status: spontaneous breathing, nonlabored ventilation, respiratory function stable and patient connected to nasal cannula oxygen Cardiovascular status: blood pressure returned to baseline and stable Postop Assessment: no signs of nausea or vomiting Anesthetic complications: no    Last Vitals:  Filed Vitals:   08/22/15 1445 08/22/15 1457  BP: 103/58 136/61  Pulse: 61 72  Temp: 36.2 C 36.2 C  Resp: 14 16    Last Pain:  Filed Vitals:   08/22/15 1503  PainSc: 2                  Cleda MccreedyJoseph K Piscitello

## 2015-08-22 NOTE — Discharge Instructions (Addendum)
Laparoscopic Cholecystectomy, Care After  ° °These instructions give you information on caring for yourself after your procedure. Your doctor may also give you more specific instructions. Call your doctor if you have any problems or questions after your procedure.  °HOME CARE  °Change your bandages (dressings) as told by your doctor.  °Keep the wound dry and clean. Wash the wound gently with soap and water. Pat the wound dry with a clean towel.  °Do not take baths, swim, or use hot tubs for 2 weeks, or as told by your doctor.  °Only take medicine as told by your doctor.  °Eat a normal diet as told by your doctor.  °Do not lift anything heavier than 10 pounds (4.5 kg) until your doctor says it is okay.  °Do not play contact sports for 1 week, or as told by your doctor. °GET HELP IF:  °Your wound is red, puffy (swollen), or painful.  °You have yellowish-white fluid (pus) coming from the wound.  °You have fluid draining from the wound for more than 1 day.  °You have a bad smell coming from the wound.  °Your wound breaks open. °GET HELP RIGHT AWAY IF:  °You have trouble breathing.  °You have chest pain.  °You have a fever >101  °You have pain in the shoulders (shoulder strap areas) that is getting worse.  °You feel dizzy or pass out (faint).  °You have severe belly (abdominal) pain.  °You feel sick to your stomach (nauseous) or throw up (vomit) for more than 1 day. ° ° ° °AMBULATORY SURGERY  °DISCHARGE INSTRUCTIONS ° ° °1) The drugs that you were given will stay in your system until tomorrow so for the next 24 hours you should not: ° °A) Drive an automobile °B) Make any legal decisions °C) Drink any alcoholic beverage ° ° °2) You may resume regular meals tomorrow.  Today it is better to start with liquids and gradually work up to solid foods. ° °You may eat anything you prefer, but it is better to start with liquids, then soup and crackers, and gradually work up to solid foods. ° ° °3) Please notify your doctor  immediately if you have any unusual bleeding, trouble breathing, redness and pain at the surgery site, drainage, fever, or pain not relieved by medication. ° ° ° °4) Additional Instructions: ° ° ° ° ° ° ° °Please contact your physician with any problems or Same Day Surgery at [phone], Monday through Friday 6 am to 4 pm, or [place] [facility] number at [phone]. ° ° °

## 2015-08-22 NOTE — Anesthesia Preprocedure Evaluation (Signed)
Anesthesia Evaluation  Patient identified by MRN, date of birth, ID band Patient awake    Reviewed: Allergy & Precautions, H&P , NPO status , Patient's Chart, lab work & pertinent test results  History of Anesthesia Complications (+) PONV and history of anesthetic complications  Airway Mallampati: III  TM Distance: >3 FB Neck ROM: limited    Dental  (+) Poor Dentition, Upper Dentures, Lower Dentures, Missing   Pulmonary neg pulmonary ROS, neg shortness of breath,    Pulmonary exam normal breath sounds clear to auscultation       Cardiovascular Exercise Tolerance: Good hypertension, (-) angina(-) Past MI and (-) DOE Normal cardiovascular exam+ dysrhythmias Atrial Fibrillation  Rhythm:regular Rate:Normal     Neuro/Psych negative neurological ROS  negative psych ROS   GI/Hepatic negative GI ROS, Neg liver ROS, neg GERD  ,  Endo/Other  negative endocrine ROS  Renal/GU negative Renal ROS  negative genitourinary   Musculoskeletal  (+) Arthritis ,   Abdominal   Peds  Hematology negative hematology ROS (+)   Anesthesia Other Findings Past Medical History:   Hypertension                                                 Hyperlipidemia                                               Leukopenia                                                   Complication of anesthesia                                   PONV (postoperative nausea and vomiting)                     Dysrhythmia                                                    Comment:atrial fib   Arthritis                                                      Comment:osteo  Past Surgical History:   BREAST BIOPSY                                   Bilateral                Comment:1950's-neg   BREAST BIOPSY  Left                Comment:bx/clip-neg   ESOPHAGOGASTRODUODENOSCOPY (EGD) WITH PROPOFOL  N/A 06/13/2015      Comment:Procedure:  ESOPHAGOGASTRODUODENOSCOPY (EGD)               WITH PROPOFOL;  Surgeon: Wallace Cullens, MD;                Location: [facility] ENDOSCOPY;  Service:               Gastroenterology;  Laterality: N/A;   APPENDECTOMY                                                  EYE SURGERY                                                     Comment:bilateral   ABDOMINAL HYSTERECTOMY                                       BMI    Body Mass Index   35.47 kg/m 2      Reproductive/Obstetrics negative OB ROS                             Anesthesia Physical Anesthesia Plan  ASA: III  Anesthesia Plan: General ETT   Post-op Pain Management:    Induction:   Airway Management Planned:   Additional Equipment:   Intra-op Plan:   Post-operative Plan:   Informed Consent: I have reviewed the patients History and Physical, chart, labs and discussed the procedure including the risks, benefits and alternatives for the proposed anesthesia with the patient or authorized representative who has indicated his/her understanding and acceptance.   Dental Advisory Given  Plan Discussed with: Anesthesiologist, CRNA and Surgeon  Anesthesia Plan Comments: (Patient is ok with receiving albumin )        Anesthesia Quick Evaluation

## 2015-08-23 LAB — SURGICAL PATHOLOGY

## 2015-08-29 ENCOUNTER — Encounter: Payer: Self-pay | Admitting: Surgery

## 2015-08-30 ENCOUNTER — Ambulatory Visit (INDEPENDENT_AMBULATORY_CARE_PROVIDER_SITE_OTHER): Payer: Medicare Other | Admitting: Surgery

## 2015-08-30 ENCOUNTER — Telehealth: Payer: Self-pay

## 2015-08-30 ENCOUNTER — Encounter: Payer: Self-pay | Admitting: Surgery

## 2015-08-30 VITALS — BP 153/89 | HR 97 | Temp 98.0°F | Ht 62.0 in | Wt 194.8 lb

## 2015-08-30 DIAGNOSIS — Z09 Encounter for follow-up examination after completed treatment for conditions other than malignant neoplasm: Secondary | ICD-10-CM

## 2015-08-30 NOTE — Progress Notes (Signed)
S/p lap chole for GS Doing great, no pain no complaints GI sxs resolved  PE NAd Abd: soft, Nt, incisions c/d/i, periumbilical area w some resolving ecchymosis   A/p Doing very well Path d/w pt  RTC prn No heavy lifting

## 2015-08-30 NOTE — Telephone Encounter (Signed)
Chrystle Marcell Barlow called stating that [employer] had a question and would like for [place] contact them to clarify.   I then called [employer] and left a voicemail to [employer]: [phone] as per Chrystle's request. Awaiting for a return call.

## 2015-08-30 NOTE — Patient Instructions (Signed)
Please call our office if you have any questions or concerns.  

## 2015-09-03 NOTE — Telephone Encounter (Signed)
[employer] representative returned my call and stated that there was nothing else he needed from me. However, he stated that if he needed additional information from [place], that he will call us.

## 2016-06-16 ENCOUNTER — Other Ambulatory Visit: Payer: Self-pay | Admitting: Internal Medicine

## 2016-06-16 DIAGNOSIS — Z1231 Encounter for screening mammogram for malignant neoplasm of breast: Secondary | ICD-10-CM

## 2016-07-20 ENCOUNTER — Ambulatory Visit
Admission: RE | Admit: 2016-07-20 | Discharge: 2016-07-20 | Disposition: A | Discharge status: Home / Self Care | Payer: Medicare Other | Source: Ambulatory Visit | Attending: Internal Medicine | Admitting: Internal Medicine

## 2016-07-20 DIAGNOSIS — Z1231 Encounter for screening mammogram for malignant neoplasm of breast: Secondary | ICD-10-CM | POA: Diagnosis not present

## 2017-06-29 ENCOUNTER — Other Ambulatory Visit: Payer: Self-pay | Admitting: Internal Medicine

## 2017-06-29 DIAGNOSIS — Z1231 Encounter for screening mammogram for malignant neoplasm of breast: Secondary | ICD-10-CM

## 2017-07-22 ENCOUNTER — Ambulatory Visit
Admission: RE | Admit: 2017-07-22 | Discharge: 2017-07-22 | Disposition: A | Discharge status: Home / Self Care | Payer: Medicare Other | Source: Ambulatory Visit | Attending: Internal Medicine | Admitting: Internal Medicine

## 2017-07-22 DIAGNOSIS — Z1231 Encounter for screening mammogram for malignant neoplasm of breast: Secondary | ICD-10-CM | POA: Diagnosis not present

## 2019-05-10 ENCOUNTER — Encounter: Payer: Self-pay | Admitting: Emergency Medicine

## 2019-05-10 ENCOUNTER — Emergency Department: Payer: Medicare Other

## 2019-05-10 ENCOUNTER — Emergency Department
Admission: EM | Admit: 2019-05-10 | Discharge: 2019-05-10 | Disposition: A | Discharge status: Home / Self Care | Payer: Medicare Other | Attending: Emergency Medicine | Admitting: Emergency Medicine

## 2019-05-10 ENCOUNTER — Other Ambulatory Visit: Payer: Self-pay

## 2019-05-10 DIAGNOSIS — R202 Paresthesia of skin: Secondary | ICD-10-CM | POA: Diagnosis not present

## 2019-05-10 DIAGNOSIS — M79601 Pain in right arm: Secondary | ICD-10-CM | POA: Diagnosis not present

## 2019-05-10 DIAGNOSIS — R200 Anesthesia of skin: Secondary | ICD-10-CM | POA: Insufficient documentation

## 2019-05-10 DIAGNOSIS — Z79899 Other long term (current) drug therapy: Secondary | ICD-10-CM | POA: Diagnosis not present

## 2019-05-10 DIAGNOSIS — Z7982 Long term (current) use of aspirin: Secondary | ICD-10-CM | POA: Diagnosis not present

## 2019-05-10 DIAGNOSIS — I10 Essential (primary) hypertension: Secondary | ICD-10-CM | POA: Diagnosis not present

## 2019-05-10 LAB — CBC WITH DIFFERENTIAL/PLATELET
Abs Immature Granulocytes: 0.04 10*3/uL (ref 0.00–0.07)
Basophils Absolute: 0 10*3/uL (ref 0.0–0.1)
Basophils Relative: 0 %
Eosinophils Absolute: 0.1 10*3/uL (ref 0.0–0.5)
Eosinophils Relative: 2 %
HCT: 42.5 % (ref 36.0–46.0)
Hemoglobin: 13.4 g/dL (ref 12.0–15.0)
Immature Granulocytes: 1 %
Lymphocytes Relative: 43 %
Lymphs Abs: 2 10*3/uL (ref 0.7–4.0)
MCH: 31.1 pg (ref 26.0–34.0)
MCHC: 31.5 g/dL (ref 30.0–36.0)
MCV: 98.6 fL (ref 80.0–100.0)
Monocytes Absolute: 0.4 10*3/uL (ref 0.1–1.0)
Monocytes Relative: 10 %
Neutro Abs: 2.1 10*3/uL (ref 1.7–7.7)
Neutrophils Relative %: 44 %
Platelets: 163 10*3/uL (ref 150–400)
RBC: 4.31 MIL/uL (ref 3.87–5.11)
RDW: 13.1 % (ref 11.5–15.5)
WBC: 4.6 10*3/uL (ref 4.0–10.5)
nRBC: 0 % (ref 0.0–0.2)

## 2019-05-10 LAB — BASIC METABOLIC PANEL
Anion gap: 9 (ref 5–15)
BUN: 16 mg/dL (ref 8–23)
CO2: 26 mmol/L (ref 22–32)
Calcium: 8.9 mg/dL (ref 8.9–10.3)
Chloride: 103 mmol/L (ref 98–111)
Creatinine, Ser: 1.01 mg/dL — ABNORMAL HIGH (ref 0.44–1.00)
GFR calc Af Amer: 59 mL/min — ABNORMAL LOW (ref >60)
GFR calc non Af Amer: 51 mL/min — ABNORMAL LOW (ref >60)
Glucose, Bld: 123 mg/dL — ABNORMAL HIGH (ref 70–99)
Potassium: 3.5 mmol/L (ref 3.5–5.1)
Sodium: 138 mmol/L (ref 135–145)

## 2019-05-10 LAB — TROPONIN I (HIGH SENSITIVITY)
Troponin I (High Sensitivity): 9 ng/L (ref <18)
Troponin I (High Sensitivity): 10 ng/L (ref <18)

## 2019-05-10 NOTE — ED Triage Notes (Addendum)
Pt here for right arm pain starting at 0730.  Sleeps with right arm bent up and worked in garden yesterday. Also c/o numbness in right arm.  Decreased sensation in right arm compared to left. No other neuro deficits.  Dr Marisa Severin notified of pt

## 2019-05-10 NOTE — ED Notes (Signed)
Transported to CT 

## 2019-05-10 NOTE — ED Notes (Signed)
Pt transported to MRI 

## 2019-05-10 NOTE — ED Notes (Signed)
Pain to right bicep area when raising arms for NIH eval.

## 2019-05-10 NOTE — ED Notes (Signed)
Pt returned from MRI °

## 2019-05-10 NOTE — Consult Note (Signed)
Neurology:  86 y/o female woke up this AM and started to feel pain in the RUE and some distal numbness on RUE. Usually sleeps on the R arm and uses 2 pillows.   Neurological Examination   Mental Status: Alert, oriented, thought content appropriate.  Speech fluent without evidence of aphasia.  Able to follow 3 step commands without difficulty. Cranial Nerves: II: Discs flat bilaterally; Visual fields grossly normal, pupils equal, round, reactive to light and accommodation III,IV, VI: ptosis not present, extra-ocular motions intact bilaterally V,VII: smile symmetric, facial light touch sensation normal bilaterally VIII: hearing normal bilaterally IX,X: gag reflex present XI: bilateral shoulder shrug XII: midline tongue extension Motor: Right : Upper extremity   5/5 Distally grip 4/5   Left:     Upper extremity   5/5  Lower extremity   5/5     Lower extremity   5/5 Tone and bulk:normal tone throughout; no atrophy noted Sensory: subjective numbness distal RUE Deep Tendon Reflexes: 1+ and symmetric throughout Plantars: Right: downgoing   Left: downgoing Cerebellar: normal finger-to-nose  A/P - Pain is reproducible and suspect a component on neuropraxia.   - No acute abnormality on MRI Brain or C spine - likely neuropraxia/trauma - d/c planning - if doesn't resolve in 1-2 weeks, can have EMG as out patient

## 2019-05-10 NOTE — Discharge Instructions (Addendum)
You may take Tylenol as needed for pain and to decrease the inflammation.  Do gentle exercises as we instructed.  Return to the ER for new, worsening, or persistent severe pain, worsening numbness, weakness, any weakness or numbness in other limbs, severe headache, vision changes, or any other new or worsening symptoms that concern you.  Follow-up with your regular doctor.

## 2019-05-10 NOTE — ED Provider Notes (Signed)
[facility] Emergency Department Provider Note ____________________________________________   First MD Initiated Contact with Patient 05/10/19 [phone]     (approximate)  I have reviewed the triage vital signs and the nursing notes.   HISTORY  Chief Complaint Arm Pain    HPI Anita Horne is a 86 y.o. female with PMH as noted below who presents with right arm pain, acute onset this morning around 730, mainly in the upper arm, and associated with some numbness and tingling more in the distal arm especially over the top of the forearm and the back of the hand.  She reports that it was a bit difficult to raise her arm due to the pain, but is not she states that the symptoms have improved although are still present.  She denies any other weakness or numbness, and has no speech disturbance or vision changes.  She denies any prior history of similar symptoms.  She had a list of stroke symptoms on her refrigerator and became concerned she was having a stroke, so came to the ED.  Past Medical History:  Diagnosis Date  . Arthritis    osteo  . Complication of anesthesia   . Dysrhythmia    atrial fib  . Hyperlipidemia   . Hypertension   . Leukopenia   . PONV (postoperative nausea and vomiting)     Patient Active Problem List   Diagnosis Date Noted  . Gallstones   . Disturbance in sleep behavior 08/07/2015  . Decreased leukocytes 08/07/2015  . HLD (hyperlipidemia) 08/07/2015  . Chronic LBP 08/07/2015  . Atrial fibrillation (HCC) 08/07/2015  . Chronic superficial gastritis 07/04/2015  . Calculus of gallbladder 12/04/2014  . Abnormal CAT scan 12/04/2014  . Hypoxia 12/21/2013    Past Surgical History:  Procedure Laterality Date  . ABDOMINAL HYSTERECTOMY    . APPENDECTOMY    . BREAST BIOPSY Bilateral    1950's-neg  . BREAST BIOPSY Left    bx/clip-neg  . CHOLECYSTECTOMY N/A 08/22/2015   Procedure: LAPAROSCOPIC CHOLECYSTECTOMY;  Surgeon: Leafy Ro, MD;   Location: [facility] ORS;  Service: General;  Laterality: N/A;  . ESOPHAGOGASTRODUODENOSCOPY (EGD) WITH PROPOFOL N/A 06/13/2015   Procedure: ESOPHAGOGASTRODUODENOSCOPY (EGD) WITH PROPOFOL;  Surgeon: Wallace Cullens, MD;  Location: [facility] ENDOSCOPY;  Service: Gastroenterology;  Laterality: N/A;  . EYE SURGERY     bilateral    Prior to Admission medications   Medication Sig Start Date End Date Taking? Authorizing Provider  aspirin 325 MG tablet Take 325 mg by mouth daily.    [provider]  azelastine (ASTELIN) 0.1 % nasal spray Place 1 spray into both nostrils 2 (two) times daily as needed for rhinitis or allergies. Use in each nostril as directed    [provider]  calcium carbonate (OSCAL) 1500 (600 Ca) MG TABS tablet Take 1 tablet by mouth daily.     [provider]  calcium carbonate (TUMS - DOSED IN MG ELEMENTAL CALCIUM) 500 MG chewable tablet Chew 1 tablet by mouth daily as needed for indigestion or heartburn.    [provider]  CARTIA XT 120 MG 24 hr capsule Take 120 mg by mouth daily.  05/06/15   [provider]  HYDROcodone-acetaminophen (NORCO/VICODIN) 5-325 MG tablet Take 1-2 tablets by mouth every 6 (six) hours as needed for moderate pain. 08/22/15   Pabon, Diego F, MD  Magnesium 250 MG TABS Take 1 tablet by mouth daily.     [provider]  Multiple Vitamin (MULTI-VITAMINS) TABS  Take 1 tablet by mouth daily.     [provider]  omega-3 acid ethyl esters (LOVAZA) 1 g capsule Take 1 g by mouth daily.     [provider]  ondansetron (ZOFRAN) 4 MG tablet Take 1 tablet (4 mg total) by mouth every 8 (eight) hours as needed for nausea or vomiting. 07/31/15   Governor Rooks, MD  OVER THE COUNTER MEDICATION Apply 1 application topically daily as needed (athletes foot cream).    [provider]  pantoprazole (PROTONIX) 40 MG tablet Take 40 mg by mouth daily.  08/07/15   [provider]    Allergies Citrus,  Oxycodone-acetaminophen, and Zofran [ondansetron hcl]  Family History  Problem Relation Age of Onset  . Breast cancer Sister     Social History Social History   Tobacco Use  . Smoking status: Never Smoker  . Smokeless tobacco: Never Used  Substance Use Topics  . Alcohol use: No  . Drug use: No    Review of Systems  Constitutional: No fever. Eyes: No visual changes. ENT: No sore throat. Cardiovascular: Denies chest pain. Respiratory: Denies shortness of breath. Gastrointestinal: No vomiting or diarrhea.  Genitourinary: Negative for pain. Musculoskeletal: Negative for back pain.  Positive for right arm pain. Skin: Negative for rash. Neurological: Negative for headaches.  Positive for right arm tingling and numbness..   ____________________________________________   PHYSICAL EXAM:  VITAL SIGNS: ED Triage Vitals  Enc Vitals Group     BP 05/10/19 0945 (!) 175/99     Pulse Rate 05/10/19 0901 79     Resp 05/10/19 0901 20     Temp 05/10/19 0901 97.7 F (36.5 C)     Temp Source 05/10/19 0901 Oral     SpO2 05/10/19 0901 97 %     Weight 05/10/19 0854 198 lb (89.8 kg)     Height 05/10/19 0854 5\' 2"  (1.575 m)     Head Circumference --      Peak Flow --      Pain Score 05/10/19 0853 7     Pain Loc --      Pain Edu? --      Excl. in GC? --     Constitutional: Alert and oriented. Well appearing and in no acute distress. Eyes: Conjunctivae are normal.  Head: Atraumatic. Nose: No congestion/rhinnorhea. Mouth/Throat: Mucous membranes are moist.   Neck: Normal range of motion.  Cardiovascular: Normal rate, regular rhythm.  Good peripheral circulation. Respiratory: Normal respiratory effort.  No retractions.  Gastrointestinal:  No distention.  Musculoskeletal: Extremities warm and well perfused.  Neurologic:  Normal speech and language.  Subjective decreased sensation to the right dorsal forearm and back of the hand.  5/5 motor strength and otherwise intact sensation to  all 4 extremities.  Normal grip strength bilaterally.  No facial droop.  No ataxia on finger-to-nose.  No pronator drift. Skin:  Skin is warm and dry. No rash noted. Psychiatric: Mood and affect are normal. Speech and behavior are normal.  ____________________________________________   LABS (all labs ordered are listed, but only abnormal results are displayed)  Labs Reviewed  BASIC METABOLIC PANEL - Abnormal; Notable for the following components:      Result Value   Glucose, Bld 123 (*)    Creatinine, Ser 1.01 (*)    GFR calc non Af Amer 51 (*)    GFR calc Af Amer 59 (*)    All other components within normal limits  CBC WITH DIFFERENTIAL/PLATELET  TROPONIN I (HIGH  SENSITIVITY)  TROPONIN I (HIGH SENSITIVITY)   ____________________________________________  EKG  ED ECG REPORT I, Arta Silence, the attending physician, personally viewed and interpreted this ECG.  Date: 05/10/2019 EKG Time: 0908 Rate: 79 Rhythm: normal sinus rhythm QRS Axis: normal Intervals: RBBB ST/T Wave abnormalities: Nonspecific T wave abnormalities laterally Narrative Interpretation: Nonspecific abnormalities with no evidence of acute ischemia  ____________________________________________  RADIOLOGY  CT head: No acute abnormality MR brain: No evidence of ischemia or other acute abnormality MR cervical spine: C3-C4 foraminal stenosis with no other acute findings  ____________________________________________   PROCEDURES  Procedure(s) performed: No  Procedures  Critical Care performed: No ____________________________________________   INITIAL IMPRESSION / ASSESSMENT AND PLAN / ED COURSE  Pertinent labs & imaging results that were available during my care of the patient were reviewed by me and considered in my medical decision making (see chart for details).  86 year old female with PMH as noted above and no prior stroke history presents with acute onset of right upper arm pain this  morning, which then developed into tingling and numbness in the distal right arm especially over the back of the hand.  The patient was concerned she was having a stroke and came to the hospital.  She denies any other neurologic symptoms.  On exam, she is overall very well-appearing.  Her vital signs are normal except for hypertension.  The physical exam is unremarkable except for subjective decreased sensation to the back of the right hand.  The patient has no other neurologic deficits.  She does have some muscle tenderness in the proximal arm.  Overall I suspect most likely radiculopathy or other benign peripheral nerve etiology given the highly localized nature of the symptoms and the presence of the pain.  However, given the patient's age will obtain CT head and lab work-up to evaluate for CVA/TIA, which I feel is much less likely.  Given the patient's NIH of 1 and the resolving symptoms, there is no indication for code stroke activation.  ----------------------------------------- 3:12 PM on 05/10/2019 -----------------------------------------  CT head showed no acute findings, and the lab work-up is unremarkable.  Based on shared decision making with the patient, we obtained an MRI of the brain and C-spine, both which were also negative for acute findings.  I discussed the case with Dr. Irish Elders from neurology who evaluated the patient in the ED.  He agrees that this is consistent with muscle strain with some neuropraxia or mild radicular symptoms.  He does not recommend further work-up for a stroke.  I had an extensive discussion with the patient and her family member about the results of the work-up and the plan of care.  Return precautions given, and she expresses understanding.  ____________________________________________   FINAL CLINICAL IMPRESSION(S) / ED DIAGNOSES  Final diagnoses:  Right arm pain      NEW MEDICATIONS STARTED DURING THIS VISIT:  New Prescriptions   No  medications on file     Note:  This document was prepared using Dragon voice recognition software and may include unintentional dictation errors.   Arta Silence, MD 05/10/19 1513

## 2021-02-16 ENCOUNTER — Ambulatory Visit
Admission: EM | Admit: 2021-02-16 | Discharge: 2021-02-16 | Disposition: A | Discharge status: Home / Self Care | Payer: Medicare Other | Attending: Emergency Medicine | Admitting: Emergency Medicine

## 2021-02-16 ENCOUNTER — Other Ambulatory Visit: Payer: Self-pay

## 2021-02-16 DIAGNOSIS — I10 Essential (primary) hypertension: Secondary | ICD-10-CM

## 2021-02-16 DIAGNOSIS — B9689 Other specified bacterial agents as the cause of diseases classified elsewhere: Secondary | ICD-10-CM

## 2021-02-16 DIAGNOSIS — N760 Acute vaginitis: Secondary | ICD-10-CM

## 2021-02-16 LAB — WET PREP, GENITAL
Sperm: NONE SEEN
Trich, Wet Prep: NONE SEEN
WBC, Wet Prep HPF POC: <10 (ref <10)
Yeast Wet Prep HPF POC: NONE SEEN

## 2021-02-16 LAB — URINALYSIS, COMPLETE (UACMP) WITH MICROSCOPIC
Bacteria, UA: NONE SEEN
Bilirubin Urine: NEGATIVE
Glucose, UA: NEGATIVE mg/dL
Hgb urine dipstick: NEGATIVE
Ketones, ur: NEGATIVE mg/dL
Leukocytes,Ua: NEGATIVE
Nitrite: NEGATIVE
Protein, ur: NEGATIVE mg/dL
Specific Gravity, Urine: 1.01 (ref 1.005–1.030)
pH: 6 (ref 5.0–8.0)

## 2021-02-16 MED ORDER — METRONIDAZOLE 500 MG PO TABS: 500.0000 mg | ORAL_TABLET | Freq: Two times a day (BID) | ORAL | 0 refills | Status: DC

## 2021-02-16 NOTE — ED Provider Notes (Signed)
[facility]    CSN: [id number] Arrival date & time: 02/16/21  1041      History   Chief Complaint Chief Complaint  Patient presents with   Hypertension    HPI Anita Horne is a 88 y.o. female.   HPI  88 year old female here for evaluation of blood pressure.  Patient has a history of hypertension and is prescribed diltiazem 120 mg extended release once daily.  She states that she does take her blood pressure medication but not always at the same time of day.  She had a visit with her PCP on 02/13/2021 at that time her blood pressure was 134/86.  Following her visit with her PCP she noticed that she had an increase in urinary frequency, urgency, increased nocturia, and developed pain in her right flank.  She denies any headache, changes to vision, dizziness, chest pain, shortness of breath, or nausea.  She denies any hematuria.  Her family member showed me some urinalysis results from 02/06/2021 which showed moderate bacteria and moderate squamous cells in the urine indicating possible skin contamination.  The urine also contained moderate amount of yeast.  Past Medical History:  Diagnosis Date   Arthritis    osteo   Complication of anesthesia    Dysrhythmia    atrial fib   Hyperlipidemia    Hypertension    Leukopenia    PONV (postoperative nausea and vomiting)     Patient Active Problem List   Diagnosis Date Noted   Gallstones    Disturbance in sleep behavior 08/07/2015   Decreased leukocytes 08/07/2015   HLD (hyperlipidemia) 08/07/2015   Chronic LBP 08/07/2015   Atrial fibrillation ([place]) 08/07/2015   Chronic superficial gastritis 07/04/2015   Calculus of gallbladder 12/04/2014   Abnormal CAT scan 12/04/2014   Hypoxia 12/21/2013    Past Surgical History:  Procedure Laterality Date   ABDOMINAL HYSTERECTOMY     APPENDECTOMY     BREAST BIOPSY Bilateral    1950's-neg   BREAST BIOPSY Left    bx/clip-neg   CHOLECYSTECTOMY N/A 08/22/2015   Procedure:  LAPAROSCOPIC CHOLECYSTECTOMY;  Surgeon: Jules Husbands, MD;  Location: [facility] ORS;  Service: General;  Laterality: N/A;   ESOPHAGOGASTRODUODENOSCOPY (EGD) WITH PROPOFOL N/A 06/13/2015   Procedure: ESOPHAGOGASTRODUODENOSCOPY (EGD) WITH PROPOFOL;  Surgeon: Hulen Luster, MD;  Location: [facility] ENDOSCOPY;  Service: Gastroenterology;  Laterality: N/A;   EYE SURGERY     bilateral    OB History   No obstetric history on file.      Home Medications    Prior to Admission medications   Medication Sig Start Date End Date Taking? Authorizing Provider  aspirin 325 MG tablet Take 325 mg by mouth daily.   Yes [provider]  azelastine (ASTELIN) 0.1 % nasal spray Place 1 spray into both nostrils 2 (two) times daily as needed for rhinitis or allergies. Use in each nostril as directed   Yes [provider]  calcium carbonate (OSCAL) 1500 (600 Ca) MG TABS tablet Take 1 tablet by mouth daily.    Yes [provider]  calcium carbonate (TUMS - DOSED IN MG ELEMENTAL CALCIUM) 500 MG chewable tablet Chew 1 tablet by mouth daily as needed for indigestion or heartburn.   Yes [provider]  diltiazem (TIAZAC) 120 MG 24 hr capsule Take 1 capsule by mouth daily. 02/13/20  Yes [provider]  gabapentin (NEURONTIN) 100 MG capsule Take by mouth. 12/05/20  Yes [provider]  Magnesium 250 MG TABS  Take 1 tablet by mouth daily.    Yes [provider]  metroNIDAZOLE (FLAGYL) 500 MG tablet Take 1 tablet (500 mg total) by mouth 2 (two) times daily. 02/16/21  Yes Margarette Canada, NP  Multiple Vitamin (MULTI-VITAMINS) TABS Take 1 tablet by mouth daily.    Yes [provider]  omega-3 acid ethyl esters (LOVAZA) 1 g capsule Take 1 g by mouth daily.    Yes [provider]    Family History Family History  Problem Relation Age of Onset   Breast cancer Sister     Social History Social History   Tobacco Use   Smoking status: Never   Smokeless tobacco:  Never  Substance Use Topics   Alcohol use: No   Drug use: No     Allergies   Citrus, Oxycodone-acetaminophen, and Zofran [ondansetron hcl]   Review of Systems Review of Systems  Constitutional:  Negative for fever.  Eyes:  Negative for visual disturbance.  Cardiovascular:  Negative for chest pain.  Gastrointestinal:  Negative for nausea.  Genitourinary:  Positive for flank pain, frequency and urgency. Negative for dysuria, hematuria and vaginal discharge.       Increase in nocturia going 2-3 times a night.  Neurological:  Negative for dizziness and headaches.    Physical Exam Triage Vital Signs ED Triage Vitals  Enc Vitals Group     BP 02/16/21 1100 (!) 150/103     Pulse Rate 02/16/21 1100 83     Resp 02/16/21 1100 18     Temp 02/16/21 1100 98.7 F (37.1 C)     Temp Source 02/16/21 1100 Oral     SpO2 02/16/21 1100 100 %     Weight 02/16/21 1055 183 lb (83 kg)     Height 02/16/21 1055 5\' 1"  (1.549 m)     Head Circumference --      Peak Flow --      Pain Score 02/16/21 1055 0     Pain Loc --      Pain Edu? --      Excl. in [place]? --    No data found.  Updated Vital Signs BP (!) 150/103 (BP Location: Left Arm)    Pulse 83    Temp 98.7 F (37.1 C) (Oral)    Resp 18    Ht 5\' 1"  (1.549 m)    Wt 183 lb (83 kg)    SpO2 100%    BMI 34.58 kg/m   Visual Acuity Right Eye Distance:   Left Eye Distance:   Bilateral Distance:    Right Eye Near:   Left Eye Near:    Bilateral Near:     Physical Exam Vitals and nursing note reviewed.  Constitutional:      Appearance: Normal appearance. She is not ill-appearing.  HENT:     Head: Normocephalic and atraumatic.  Cardiovascular:     Rate and Rhythm: Normal rate and regular rhythm.     Pulses: Normal pulses.     Heart sounds: Normal heart sounds. No murmur heard.   No friction rub. No gallop.  Pulmonary:     Effort: Pulmonary effort is normal.     Breath sounds: Normal breath sounds. No wheezing, rhonchi or rales.   Skin:    General: Skin is warm and dry.     Capillary Refill: Capillary refill takes less than 2 seconds.     Findings: No erythema or rash.  Neurological:     General: No focal deficit present.  Mental Status: She is alert and oriented to person, place, and time.  Psychiatric:        Mood and Affect: Mood normal.        Behavior: Behavior normal.        Thought Content: Thought content normal.        Judgment: Judgment normal.     UC Treatments / Results  Labs (all labs ordered are listed, but only abnormal results are displayed) Labs Reviewed  WET PREP, GENITAL - Abnormal; Notable for the following components:      Result Value   Clue Cells Wet Prep HPF POC PRESENT (*)    All other components within normal limits  URINALYSIS, COMPLETE (UACMP) WITH MICROSCOPIC    EKG   Radiology No results found.  Procedures Procedures (including critical care time)  Medications Ordered in UC Medications - No data to display  Initial Impression / Assessment and Plan / UC Course  I have reviewed the triage vital signs and the nursing notes.  Pertinent labs & imaging results that were available during my care of the patient were reviewed by me and considered in my medical decision making (see chart for details).  Patient is a nontoxic-appearing 88 year old female here for evaluation of elevated blood pressure readings that started today.  She is prescribed diltiazem XR 120 mg daily and states that she has been taking the same medication for years but she does not always remember to take it and does not always take it at the same time each day.  I explained to the patient that flickers of blood pressure can be expected if not taking medication as directed, especially if it is a 24-hour medication.  She denies any dizziness, headache, change in vision, chest pain, shortness breath, nausea.  Her family ember mention that she been experiencing pain in her right flank as well that the  patient states has been there for the past 3 days and is associated with increased urinary urgency and frequency, and nocturia.  She denies any pain with urination or blood in her urine.  A previous UA from 02/06/2021 shows yeast present in her urine which was not addressed by her PCP.  Will repeat urinalysis as well as check wet prep.  Patient's blood pressure and flank pain may be a result of a urinary tract or vaginal infection.  Urinalysis is unremarkable.  Wet prep shows a presence of clue cells but no yeast at present.  Will treat patient for BV with metronidazole twice daily for 7 days and have her follow-up with her PCP.  Final Clinical Impressions(s) / UC Diagnoses   Final diagnoses:  Hypertension, unspecified type  BV (bacterial vaginosis)     Discharge Instructions      Your blood pressure is mildly elevated today but is not at a concerning level.  The fluctuations in your blood pressure may be a result of you not taking your medication at the same time consistently.  Make sure you are taking your diltiazem at the same time every day to maintain more consistent blood levels to control your blood pressure.  You may continue to check your blood pressure once or twice a week.  Make sure that you have rested and relaxed it for 30 minutes prior to taking your blood pressure.  When you take your blood pressure your feet need to be flat on the floor, your bladder needs to be empty, and you need to have the arm you are measuring at heart  level for 5 minutes before taking the reading.   Take the Flagyl (metronidazole) 500 mg twice daily for treatment of your bacterial vaginosis.  Avoid alcohol while on the metronidazole as taken together will cause of vomiting.  Bacterial vaginosis is often caused by a imbalance of bacteria in your vaginal vault.  This is sometimes a result of using tampons or hormonal fluctuations during her menstrual cycle.  You if your symptoms are recurrent you  can try using a boric acid suppository twice weekly to help maintain the acid-base balance in your vagina vault which could prevent further infection.  You can also try vaginal probiotics to help return normal bacterial balance.   Follow-up with your primary care provider for continued symptoms.     ED Prescriptions     Medication Sig Dispense Auth. Provider   metroNIDAZOLE (FLAGYL) 500 MG tablet Take 1 tablet (500 mg total) by mouth 2 (two) times daily. 14 tablet Margarette Canada, NP      PDMP not reviewed this encounter.   Margarette Canada, NP 02/16/21 1203

## 2021-02-16 NOTE — Discharge Instructions (Addendum)
Your blood pressure is mildly elevated today but is not at a concerning level.  The fluctuations in your blood pressure may be a result of you not taking your medication at the same time consistently.  Make sure you are taking your diltiazem at the same time every day to maintain more consistent blood levels to control your blood pressure.  You may continue to check your blood pressure once or twice a week.  Make sure that you have rested and relaxed it for 30 minutes prior to taking your blood pressure.  When you take your blood pressure your feet need to be flat on the floor, your bladder needs to be empty, and you need to have the arm you are measuring at heart level for 5 minutes before taking the reading.   Take the Flagyl (metronidazole) 500 mg twice daily for treatment of your bacterial vaginosis.  Avoid alcohol while on the metronidazole as taken together will cause of vomiting.  Bacterial vaginosis is often caused by a imbalance of bacteria in your vaginal vault.  This is sometimes a result of using tampons or hormonal fluctuations during her menstrual cycle.  You if your symptoms are recurrent you can try using a boric acid suppository twice weekly to help maintain the acid-base balance in your vagina vault which could prevent further infection.  You can also try vaginal probiotics to help return normal bacterial balance.   Follow-up with your primary care provider for continued symptoms.

## 2021-02-16 NOTE — ED Triage Notes (Signed)
Denies chest pain, or any other SX

## 2021-02-16 NOTE — ED Triage Notes (Signed)
Pt here with C/O HBP readings at home. Went to PCP on Wednesday was 134/86. States it has been reading 150/90's at home

## 2021-06-21 IMAGING — MR MR CERVICAL SPINE W/O CM
5 series · 39 of 48 positions shown · non-contrast
Comparison: None

CLINICAL DATA: Right arm pain and numbness

EXAM:
MRI CERVICAL SPINE WITHOUT CONTRAST
TECHNIQUE: Multiplanar, multisequence MR imaging of the cervical spine was
performed. No intravenous contrast was administered.

[Series 13: T2 · sagittal · 3.0mm · 0.62mm/px · 8 of 15 slices shown (1 of 2)]
[im 1/15]
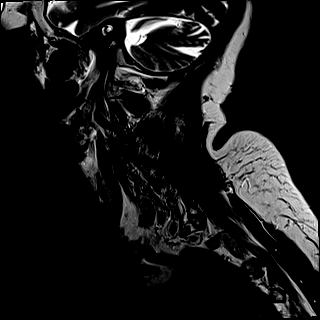
[im 3/15]
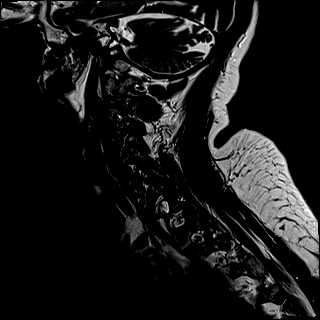
[im 5/15]
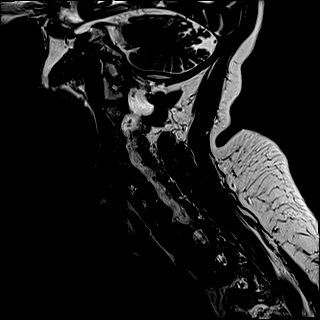
[im 7/15]
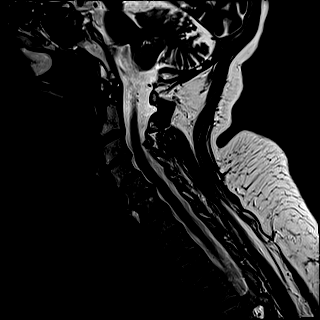
[im 9/15]
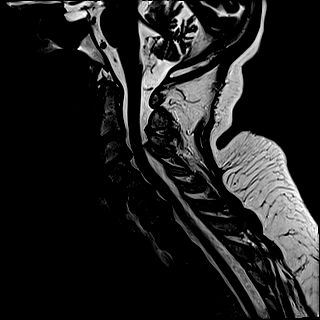
[im 11/15]
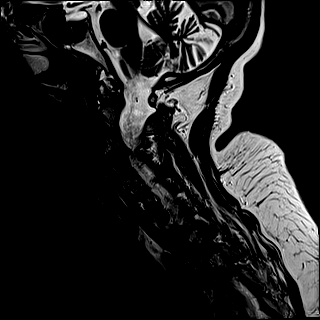
[im 13/15]
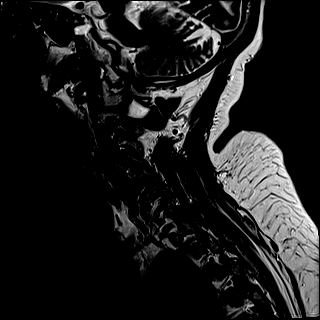
[im 15/15]
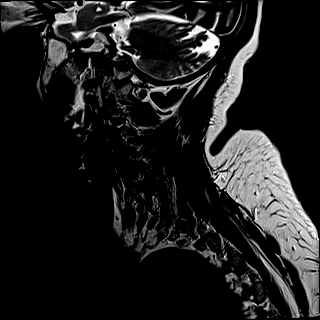

[Series 14: FLAIR · sagittal · 3.0mm · 0.78mm/px · 7 of 15 slices shown]
[im 1/15]
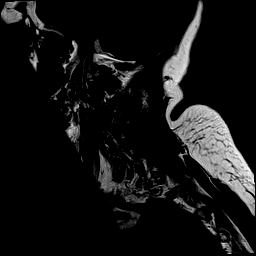
[im 3/15]
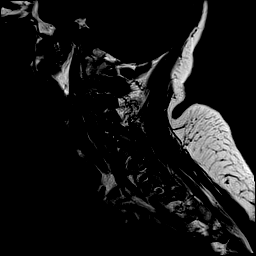
[im 5/15]
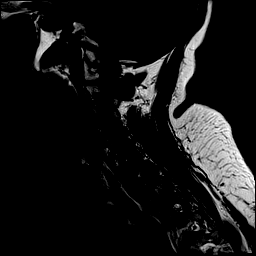
[im 8/15]
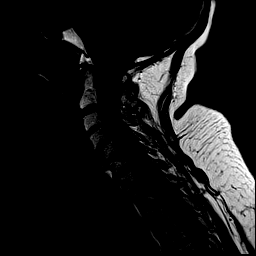
[im 10/15]
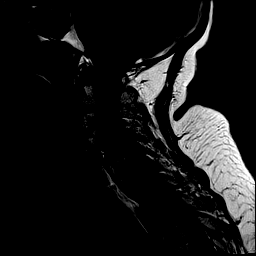
[im 12/15]
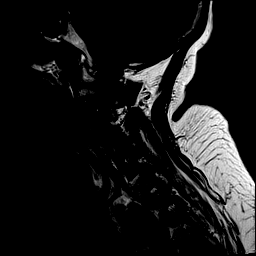
[im 15/15]
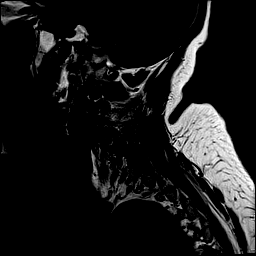

[Series 15: STIR · sagittal · 3.0mm · 0.62mm/px · 7 of 15 slices shown]
[im 1/15]
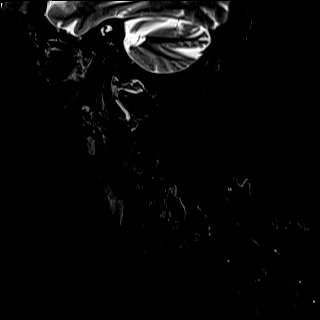
[im 3/15]
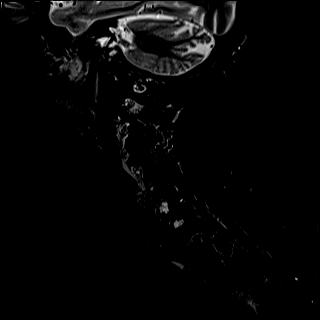
[im 5/15]
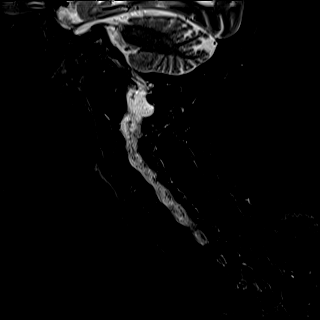
[im 8/15]
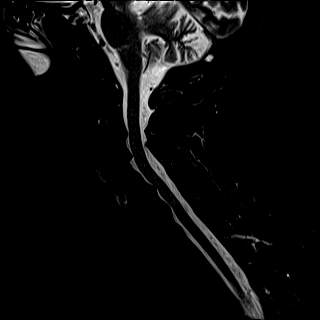
[im 10/15]
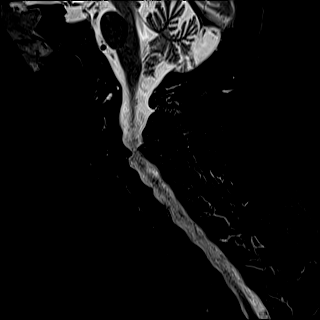
[im 12/15]
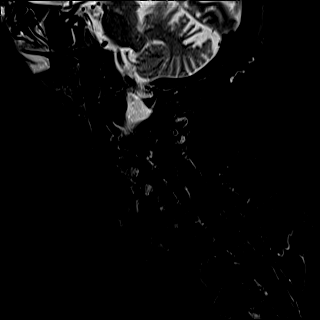
[im 15/15]
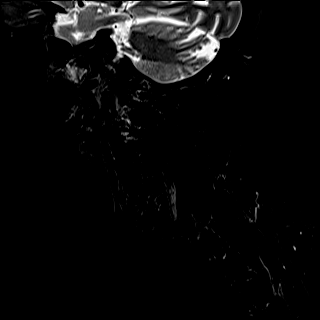

[Series 16: T2 · axial · 3.0mm · 0.70mm/px · z∈[-227,-144]mm · 9 of 27 slices shown (2 of 2)]
[im 1/27]
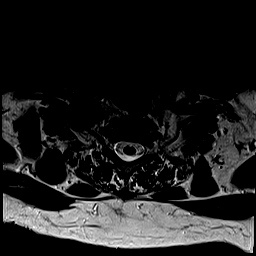
[im 5/27]
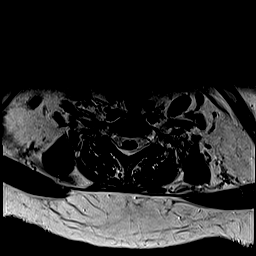
[im 9/27]
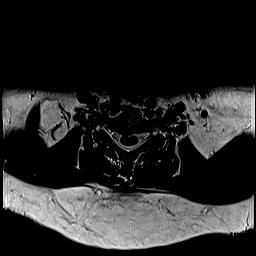
[im 11/27]
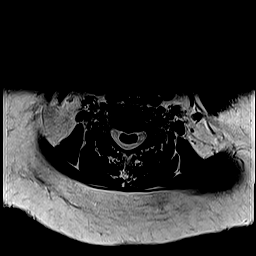
[im 14/27]
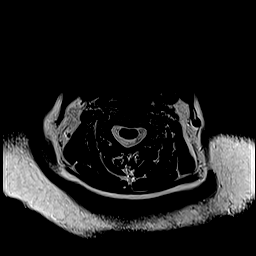
[im 16/27]
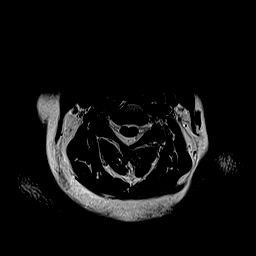
[im 18/27]
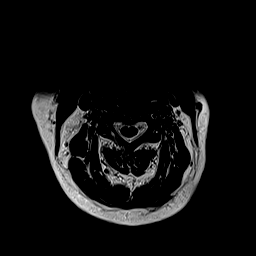
[im 22/27]
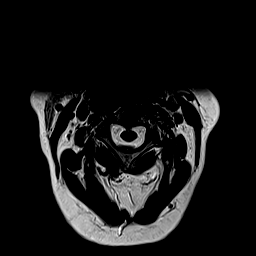
[im 27/27]
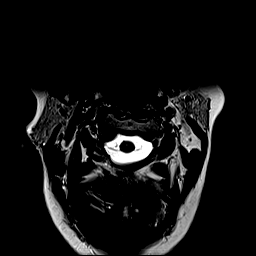

[Series 17: ax mpgr · axial · 3.0mm · 0.35mm/px · z∈[-227,-144]mm · 8 of 27 slices shown]
[im 1/27]
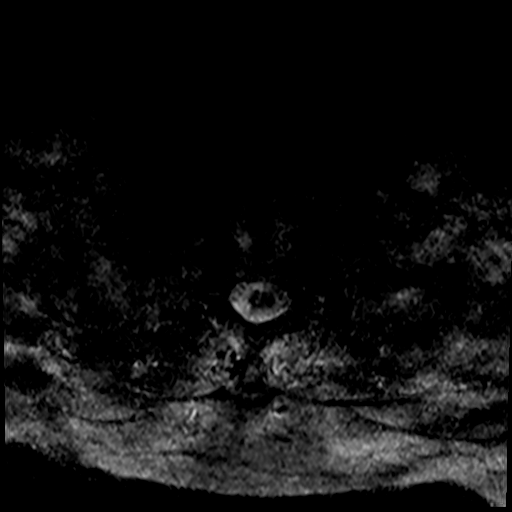
[im 5/27]
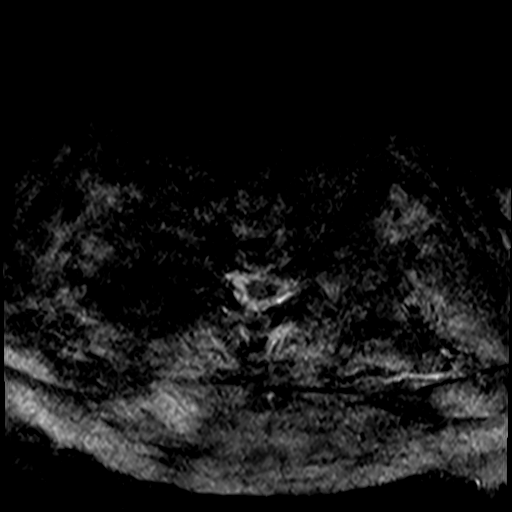
[im 9/27]
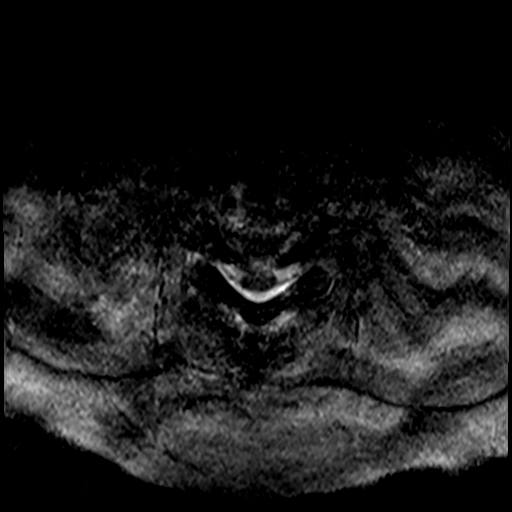
[im 11/27]
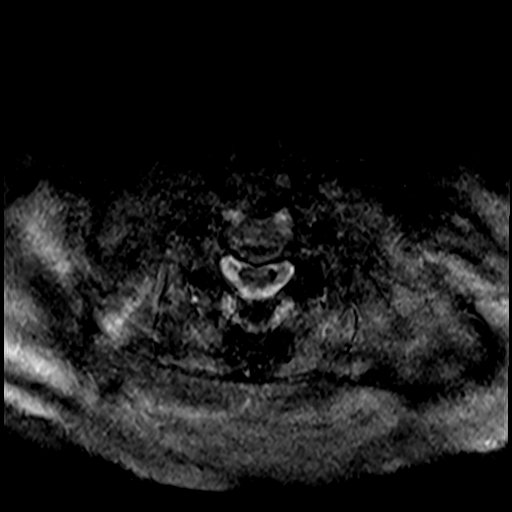
[im 16/27]
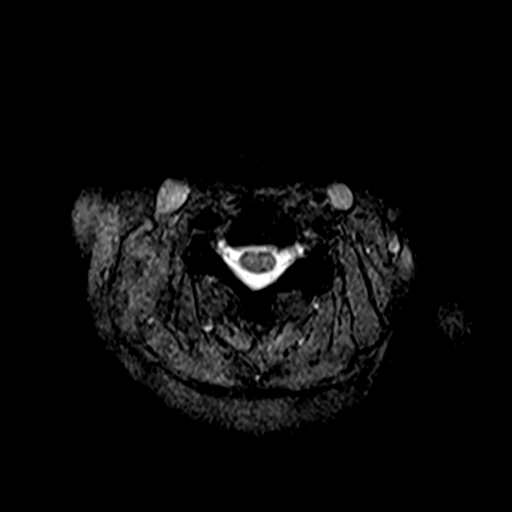
[im 18/27]
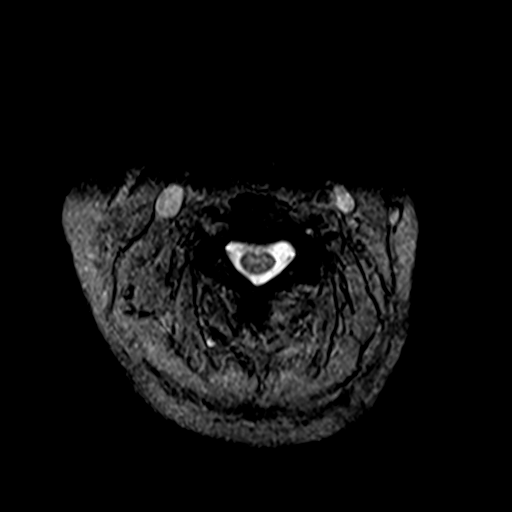
[im 22/27]
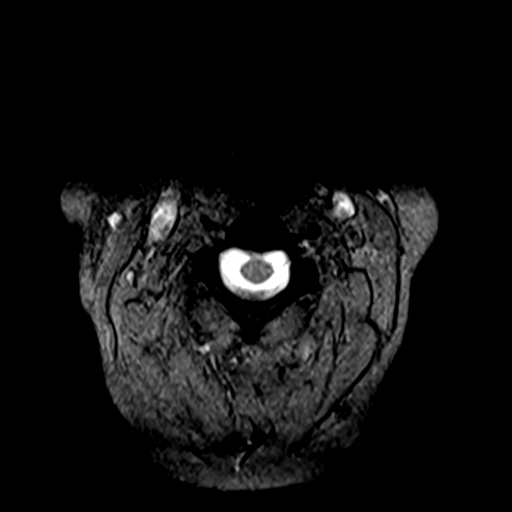
[im 27/27]
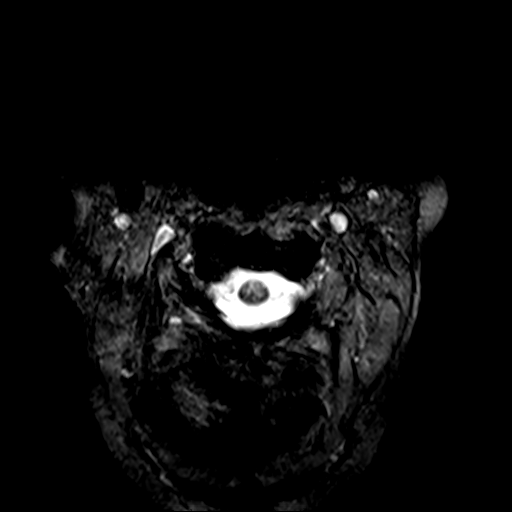

[39 of 48 positions shown; findings below may reference images not displayed]

FINDINGS: Alignment: There is trace retrolisthesis at C2-C3 and C3-C4 and
anterolisthesis at C4-C5.

Vertebrae: Vertebral body heights are maintained. There is no
significant marrow edema or suspicious osseous lesion.

Cord: No abnormal signal.

Posterior Fossa, vertebral arteries, paraspinal tissues:
Unremarkable

Disc levels:

C2-C3: Small central disc protrusion and mild facet hypertrophy. No
canal or foraminal stenosis.

C3-C4: Disc bulge with endplate osteophytes and uncovertebral and
facet hypertrophy asymmetric to the right. No canal stenosis.
Moderate to marked right foraminal stenosis. No left foraminal
stenosis.

C4-C5: Disc bulge with endplate osteophytes and facet and
uncovertebral hypertrophy. No canal or foraminal stenosis.

C5-C6: Disc bulge with endplate osteophytes and facet and
uncovertebral hypertrophy. Minor canal stenosis. No foraminal
stenosis.

C6-C7: Disc bulge with superimposed right central disc protrusion,
endplate osteophytes, and uncovertebral hypertrophy. Minor canal
stenosis. No foraminal stenosis.

C7-T1:  No canal or foraminal stenosis.
IMPRESSION: Multilevel degenerative changes as detailed above. There is no
high-grade canal stenosis. Right foraminal stenosis is present at
C3-C4.

## 2021-12-22 ENCOUNTER — Ambulatory Visit: Payer: Medicare Other

## 2022-07-09 ENCOUNTER — Other Ambulatory Visit: Payer: Self-pay

## 2022-07-09 DIAGNOSIS — R600 Localized edema: Secondary | ICD-10-CM

## 2022-07-14 ENCOUNTER — Ambulatory Visit
Admission: RE | Admit: 2022-07-14 | Discharge: 2022-07-14 | Disposition: A | Discharge status: Home / Self Care | Payer: 59 | Source: Ambulatory Visit | Attending: Internal Medicine | Admitting: Internal Medicine

## 2022-07-14 DIAGNOSIS — R600 Localized edema: Secondary | ICD-10-CM | POA: Diagnosis present

## 2022-08-10 ENCOUNTER — Ambulatory Visit
Admission: RE | Admit: 2022-08-10 | Discharge: 2022-08-10 | Disposition: A | Discharge status: Home / Self Care | Payer: 59 | Source: Ambulatory Visit | Attending: Family Medicine | Admitting: Family Medicine

## 2022-08-10 ENCOUNTER — Encounter: Payer: Self-pay | Admitting: Family Medicine

## 2022-08-10 ENCOUNTER — Ambulatory Visit (INDEPENDENT_AMBULATORY_CARE_PROVIDER_SITE_OTHER): Payer: 59 | Admitting: Family Medicine

## 2022-08-10 ENCOUNTER — Ambulatory Visit
Admission: RE | Admit: 2022-08-10 | Discharge: 2022-08-10 | Disposition: A | Discharge status: Home / Self Care | Payer: 59 | Attending: Family Medicine | Admitting: Family Medicine

## 2022-08-10 VITALS — BP 138/88 | HR 78 | Ht 62.0 in | Wt 188.0 lb

## 2022-08-10 DIAGNOSIS — G8929 Other chronic pain: Secondary | ICD-10-CM | POA: Insufficient documentation

## 2022-08-10 DIAGNOSIS — M25571 Pain in right ankle and joints of right foot: Secondary | ICD-10-CM | POA: Diagnosis present

## 2022-08-10 DIAGNOSIS — M19071 Primary osteoarthritis, right ankle and foot: Secondary | ICD-10-CM | POA: Insufficient documentation

## 2022-08-10 MED ORDER — PREDNISONE 20 MG PO TABS: 20.0000 mg | ORAL_TABLET | Freq: Every day | ORAL | 0 refills | Status: DC

## 2022-08-10 NOTE — Patient Instructions (Signed)
-   Obtain x-rays today - Apply topical diclofenac 1% (Voltaren gel) 4 times daily x 1 week (can continue if helping) - If still symptomatic despite Voltaren, dose prednisone daily x 5 days - Return in 2 weeks

## 2022-08-10 NOTE — Assessment & Plan Note (Signed)
Presents with daughter regarding ongoing right anterolateral ankle pain since January 2023, atraumatic in onset. Describes stiffness after a period of immobility, early AM pain, pain with weightbearing, denies radiation, no response from gabapentin.  Exam demonstrates pain at the anterior talotibial joint line, no laxity, painful limited ROM. Sensorimotor intact.  Findings most consistent with osteoarthritis related symptoms. Plan as follows:  - Obtain x-rays to assess degenerative changes - Apply topical diclofenac 1% (Voltaren gel) 4 times daily x 1 week (can continue if helping) - If still symptomatic despite Voltaren, dose prednisone daily x 5 days - Return in 2 weeks - Persistent symptoms can be addressed with consideration of intra-articular cortisone, immobilization, etc

## 2022-08-10 NOTE — Progress Notes (Signed)
     Primary Care / Sports Medicine Office Visit  Patient Information:  Patient ID: Anita Horne, female DOB: May 12, 1933 Age: 89 y.o. MRN: [id number]   Anita Horne is a pleasant 89 y.o. female presenting with the following:  Chief Complaint  Patient presents with   Ankle Pain    Since janurary, no injury. Swelling, has xray in [place] [facility]     Vitals:   08/10/22 1333  BP: 138/88  Pulse: 78  SpO2: 98%   Vitals:   08/10/22 1333  Weight: 188 lb (85.3 kg)  Height: 5\' 2"  (1.575 m)   Body mass index is 34.39 kg/m.     Independent interpretation of notes and tests performed by another provider:   None  Procedures performed:   None  Pertinent History, Exam, Impression, and Recommendations:   [name] was seen today for ankle pain.  Chronic pain of right ankle Assessment & Plan: Presents with daughter regarding ongoing right anterolateral ankle pain since January 2023, atraumatic in onset. Describes stiffness after a period of immobility, early AM pain, pain with weightbearing, denies radiation, no response from gabapentin.  Exam demonstrates pain at the anterior talotibial joint line, no laxity, painful limited ROM. Sensorimotor intact.  Findings most consistent with osteoarthritis related symptoms. Plan as follows:  - Obtain x-rays to assess degenerative changes - Apply topical diclofenac 1% (Voltaren gel) 4 times daily x 1 week (can continue if helping) - If still symptomatic despite Voltaren, dose prednisone daily x 5 days - Return in 2 weeks - Persistent symptoms can be addressed with consideration of intra-articular cortisone, immobilization, etc  Orders: -     DG Foot Complete Right; Future -     DG Ankle Complete Right; Future  Other orders -     predniSONE; Take 1 tablet (20 mg total) by mouth daily.  Dispense: 5 tablet; Refill: 0     Orders & Medications Meds ordered this encounter  Medications   predniSONE (DELTASONE) 20 MG tablet    Sig:  Take 1 tablet (20 mg total) by mouth daily.    Dispense:  5 tablet    Refill:  0   Orders Placed This Encounter  Procedures   DG Foot Complete Right   DG Ankle Complete Right     Return in about 2 weeks (around 08/24/2022).     Jerrol Banana, MD, [facility]   Primary Care Sports Medicine Primary Care and Sports Medicine at [facility]

## 2022-08-21 ENCOUNTER — Other Ambulatory Visit (INDEPENDENT_AMBULATORY_CARE_PROVIDER_SITE_OTHER): Payer: 59 | Admitting: Radiology

## 2022-08-21 ENCOUNTER — Ambulatory Visit (INDEPENDENT_AMBULATORY_CARE_PROVIDER_SITE_OTHER): Payer: 59 | Admitting: Family Medicine

## 2022-08-21 ENCOUNTER — Encounter: Payer: Self-pay | Admitting: Family Medicine

## 2022-08-21 VITALS — BP 134/82 | HR 72

## 2022-08-21 DIAGNOSIS — M25571 Pain in right ankle and joints of right foot: Secondary | ICD-10-CM

## 2022-08-21 DIAGNOSIS — G8929 Other chronic pain: Secondary | ICD-10-CM | POA: Diagnosis not present

## 2022-08-21 DIAGNOSIS — M19071 Primary osteoarthritis, right ankle and foot: Secondary | ICD-10-CM

## 2022-08-21 MED ORDER — TRIAMCINOLONE ACETONIDE 40 MG/ML IJ SUSP: 40.0000 mg | Freq: Once | INTRAMUSCULAR | Status: AC

## 2022-08-21 NOTE — Patient Instructions (Signed)
You have just been given a cortisone injection to reduce pain and inflammation. After the injection you may notice immediate relief of pain as a result of the Lidocaine. It is important to rest the area of the injection for 24 to 48 hours after the injection. There is a possibility of some temporary increased discomfort and swelling for up to 72 hours until the cortisone begins to work. If you do have pain, simply rest the joint and use ice. If you can tolerate over the counter medications, you can try Tylenol, Aleve, or Advil for added relief per package instructions. - Recommend supportive shoe wear on a regular basis - Follow-up as needed

## 2022-08-21 NOTE — Progress Notes (Signed)
     Primary Care / Sports Medicine Office Visit  Patient Information:  Patient ID: CHIRSTY ARMISTEAD, female DOB: Mar 26, 1933 Age: 89 y.o. MRN: [id number]   CYNARA TATHAM is a pleasant 89 y.o. female presenting with the following:  Chief Complaint  Patient presents with   Ankle Pain    Prednisone helped a lot, pain came back after stop taking it    Vitals:   08/21/22 0943  BP: 134/82  Pulse: 72  SpO2: 98%   There were no vitals filed for this visit. There is no height or weight on file to calculate BMI.     Independent interpretation of notes and tests performed by another provider:   Independent interpretation of right foot and ankle dated 08/13/2022 demonstrates generalized soft tissue swelling, degenerative changes at the first MTP, midfoot, tibiotalar joint.  No acute osseous processes identified  Procedures performed:   Procedure:  Injection of right ankle joint under ultrasound guidance. Ultrasound guidance utilized for out of plane approach to the right ankle joint, intrasubstance soft tissue swelling noted with hypoechoic regions Samsung HS60 device utilized with permanent recording / reporting. Verbal informed consent obtained and verified. Skin prepped in a sterile fashion. Ethyl chloride for topical local analgesia.  Completed without difficulty and tolerated well. Medication: triamcinolone acetonide 40 mg/mL suspension for injection 1 mL total and 2 mL lidocaine 1% without epinephrine utilized for needle placement anesthetic Advised to contact for fevers/chills, erythema, induration, drainage, or persistent bleeding.   Pertinent History, Exam, Impression, and Recommendations:   Kiannah was seen today for ankle pain.  Primary osteoarthritis of right ankle Assessment & Plan: Chronic, ongoing symptomatology.  At last visit she was advised to proceed with x-rays, start steroids, and utilize Voltaren.  She noted significant improvement with prednisone however rapid  recurrence of symptoms thereafter.  Primary localization of symptoms to the anterolateral right ankle.  We reviewed x-ray images together.  History and findings are most consistent with osteoarthritis related arthralgia at the talotibial articulation, secondary areas of osteoarthritis appear to be minimally symptomatic.  This is further exacerbated by noted pes planus.  Plan as follows: - Patient like to proceed with ultrasound-guided right ankle joint corticosteroid injection - Post care reviewed - Recommend supportive shoe wear on a regular basis - Follow-up as needed    Orders: -     [place] LIMITED JOINT SPACE STRUCTURES LOW RIGHT; Future -     Triamcinolone Acetonide     Orders & Medications Meds ordered this encounter  Medications   triamcinolone acetonide (KENALOG-40) injection 40 mg   Orders Placed This Encounter  Procedures   [place] LIMITED JOINT SPACE STRUCTURES LOW RIGHT     No follow-ups on file.     Jerrol Banana, MD, [facility]   Primary Care Sports Medicine Primary Care and Sports Medicine at [facility]

## 2022-08-21 NOTE — Assessment & Plan Note (Signed)
Chronic, ongoing symptomatology.  At last visit she was advised to proceed with x-rays, start steroids, and utilize Voltaren.  She noted significant improvement with prednisone however rapid recurrence of symptoms thereafter.  Primary localization of symptoms to the anterolateral right ankle.  We reviewed x-ray images together.  History and findings are most consistent with osteoarthritis related arthralgia at the talotibial articulation, secondary areas of osteoarthritis appear to be minimally symptomatic.  This is further exacerbated by noted pes planus.  Plan as follows: - Patient like to proceed with ultrasound-guided right ankle joint corticosteroid injection - Post care reviewed - Recommend supportive shoe wear on a regular basis - Follow-up as needed

## 2022-08-24 ENCOUNTER — Ambulatory Visit: Payer: 59 | Admitting: Family Medicine

## 2022-12-26 ENCOUNTER — Encounter: Payer: Self-pay | Admitting: Emergency Medicine

## 2022-12-26 ENCOUNTER — Ambulatory Visit
Admission: EM | Admit: 2022-12-26 | Discharge: 2022-12-26 | Disposition: A | Discharge status: Home / Self Care | Payer: 59 | Attending: Physician Assistant | Admitting: Physician Assistant

## 2022-12-26 DIAGNOSIS — J069 Acute upper respiratory infection, unspecified: Secondary | ICD-10-CM | POA: Diagnosis not present

## 2022-12-26 DIAGNOSIS — R0981 Nasal congestion: Secondary | ICD-10-CM

## 2022-12-26 DIAGNOSIS — R051 Acute cough: Secondary | ICD-10-CM

## 2022-12-26 LAB — RESP PANEL BY RT-PCR (RSV, FLU A&B, COVID)  RVPGX2
Influenza A by PCR: NEGATIVE
Influenza B by PCR: NEGATIVE
Resp Syncytial Virus by PCR: NEGATIVE
SARS Coronavirus 2 by RT PCR: NEGATIVE

## 2022-12-26 MED ORDER — BENZONATATE 200 MG PO CAPS: 200.0000 mg | ORAL_CAPSULE | Freq: Three times a day (TID) | ORAL | 0 refills | Status: AC | PRN

## 2022-12-26 NOTE — ED Triage Notes (Signed)
Patient c/o cough, congestion, runny nose, and chills that started on Thursday.  Patient unsure of fevers.

## 2022-12-26 NOTE — Discharge Instructions (Signed)
URI/COLD SYMPTOMS: Your exam today is consistent with a viral illness. Antibiotics are not indicated at this time. Use medications as directed, including cough syrup, nasal saline, and decongestants. Your symptoms should improve over the next few days and resolve within 7-10 days. Increase rest and fluids. F/u if symptoms worsen or predominate such as sore throat, ear pain, productive cough, shortness of breath, or if you develop high fevers or worsening fatigue over the next several days.    Will call with results of COVID/flu test tomorrow.

## 2022-12-26 NOTE — ED Provider Notes (Signed)
[facility]    CSN: [id number] Arrival date & time: 12/26/22  1533      History   Chief Complaint Chief Complaint  Patient presents with   Cough   Nasal Congestion    HPI Anita Horne is a 89 y.o. female presenting for cough and congestion/runny nose, and fatigue x 2 days.  Denies fever, chills, sweats, sore throat, sinus pain, chest pain/tightness, wheezing or shortness of breath.  No sick contacts.  Patient has taken over-the-counter cough medicine.  She is concerned about the flu.  No other complaints.  HPI  Past Medical History:  Diagnosis Date   Arthritis    osteo   Complication of anesthesia    Dysrhythmia    atrial fib   Hyperlipidemia    Hypertension    Leukopenia    PONV (postoperative nausea and vomiting)     Patient Active Problem List   Diagnosis Date Noted   Osteoarthritis of right ankle 08/10/2022   Gallstones    Disturbance in sleep behavior 08/07/2015   Decreased leukocytes 08/07/2015   HLD (hyperlipidemia) 08/07/2015   Chronic low back pain 08/07/2015   Atrial fibrillation (HCC) 08/07/2015   Chronic superficial gastritis 07/04/2015   Calculus of gallbladder 12/04/2014   Abnormal CAT scan 12/04/2014   Hypoxia 12/21/2013    Past Surgical History:  Procedure Laterality Date   ABDOMINAL HYSTERECTOMY     APPENDECTOMY     BREAST BIOPSY Bilateral    1950's-neg   BREAST BIOPSY Left    bx/clip-neg   CHOLECYSTECTOMY N/A 08/22/2015   Procedure: LAPAROSCOPIC CHOLECYSTECTOMY;  Surgeon: Leafy Ro, MD;  Location: [facility];  Service: General;  Laterality: N/A;   ESOPHAGOGASTRODUODENOSCOPY (EGD) WITH PROPOFOL N/A 06/13/2015   Procedure: ESOPHAGOGASTRODUODENOSCOPY (EGD) WITH PROPOFOL;  Surgeon: Wallace Cullens, MD;  Location: [facility];  Service: Gastroenterology;  Laterality: N/A;   EYE SURGERY     bilateral    OB History   No obstetric history on file.      Home Medications    Prior to Admission medications   Medication Sig  Start Date End Date Taking? Authorizing Provider  benzonatate (TESSALON) 200 MG capsule Take 1 capsule (200 mg total) by mouth 3 (three) times daily as needed for cough. 12/26/22  Yes Shirlee Latch, PA-C  levothyroxine (SYNTHROID) 25 MCG tablet Take 25 mcg by mouth daily before breakfast. 12/03/22 12/03/23 Yes [provider]  metoprolol succinate (TOPROL-XL) 50 MG 24 hr tablet Take 1 tablet by mouth 2 (two) times daily. 10/15/22 10/15/23 Yes [provider]  aspirin 325 MG tablet Take 325 mg by mouth daily.    [provider]  azelastine (ASTELIN) 0.1 % nasal spray Place 1 spray into both nostrils 2 (two) times daily as needed for rhinitis or allergies. Use in each nostril as directed    [provider]  calcium carbonate (OSCAL) 1500 (600 Ca) MG TABS tablet Take 1 tablet by mouth daily.     [provider]  calcium carbonate (TUMS - DOSED IN MG ELEMENTAL CALCIUM) 500 MG chewable tablet Chew 1 tablet by mouth daily as needed for indigestion or heartburn.    [provider]  diltiazem (TIAZAC) 120 MG 24 hr capsule Take 1 capsule by mouth daily. 02/13/20   [provider]  FARXIGA 5 MG TABS tablet Take 5 mg by mouth daily.    [provider]  furosemide (LASIX) 20 MG tablet Take 20 mg by mouth daily.  [provider]  gabapentin (NEURONTIN) 100 MG capsule Take by mouth 2 (two) times daily. 12/05/20   [provider]  losartan (COZAAR) 25 MG tablet Take 25 mg by mouth daily.    [provider]  Magnesium 250 MG TABS Take 1 tablet by mouth daily.     [provider]  Multiple Vitamin (MULTI-VITAMINS) TABS Take 1 tablet by mouth daily.     [provider]  omega-3 acid ethyl esters (LOVAZA) 1 g capsule Take 1 g by mouth daily.     [provider]  potassium chloride (KLOR-CON) 10 MEQ tablet Take 10 mEq by mouth daily.    [provider]  pregabalin (LYRICA) 25 MG capsule  Take 25 mg by mouth 2 (two) times daily.    [provider]    Family History Family History  Problem Relation Age of Onset   Breast cancer Sister     Social History Social History   Tobacco Use   Smoking status: Never   Smokeless tobacco: Never  Vaping Use   Vaping status: Never Used  Substance Use Topics   Alcohol use: No   Drug use: No     Allergies   Citrus, Oxycodone-acetaminophen, and Zofran [ondansetron hcl]   Review of Systems Review of Systems  Constitutional:  Positive for fatigue. Negative for chills, diaphoresis and fever.  HENT:  Positive for congestion and rhinorrhea. Negative for ear pain, sinus pressure, sinus pain and sore throat.   Respiratory:  Positive for cough. Negative for shortness of breath.   Cardiovascular:  Negative for chest pain.  Gastrointestinal:  Negative for abdominal pain, nausea and vomiting.  Musculoskeletal:  Negative for arthralgias and myalgias.  Skin:  Negative for rash.  Neurological:  Negative for weakness and headaches.  Hematological:  Negative for adenopathy.     Physical Exam Triage Vital Signs ED Triage Vitals [12/26/22 1545]  Encounter Vitals Group     BP      Systolic BP Percentile      Diastolic BP Percentile      Pulse      Resp      Temp      Temp src      SpO2      Weight 188 lb 0.8 oz (85.3 kg)     Height 5\' 2"  (1.575 m)     Head Circumference      Peak Flow      Pain Score 0     Pain Loc      Pain Education      Exclude from Growth Chart    No data found.  Updated Vital Signs BP 132/78 (BP Location: Right Arm)   Pulse 95   Temp 98.8 F (37.1 C) (Oral)   Resp 15   Ht 5\' 2"  (1.575 m)   Wt 188 lb 0.8 oz (85.3 kg)   SpO2 95%   BMI 34.40 kg/m     Physical Exam Vitals and nursing note reviewed.  Constitutional:      General: She is not in acute distress.    Appearance: Normal appearance. She is not ill-appearing or toxic-appearing.  HENT:     Head: Normocephalic and  atraumatic.     Nose: Congestion present.     Mouth/Throat:     Mouth: Mucous membranes are moist.     Pharynx: Oropharynx is clear.  Eyes:     General: No scleral icterus.       Right eye: No discharge.  Left eye: No discharge.     Conjunctiva/sclera: Conjunctivae normal.  Cardiovascular:     Rate and Rhythm: Normal rate and regular rhythm.     Heart sounds: Normal heart sounds.  Pulmonary:     Effort: Pulmonary effort is normal. No respiratory distress.     Breath sounds: Normal breath sounds.  Musculoskeletal:     Cervical back: Neck supple.  Skin:    General: Skin is dry.  Neurological:     General: No focal deficit present.     Mental Status: She is alert. Mental status is at baseline.     Motor: No weakness.     Gait: Gait normal.  Psychiatric:        Mood and Affect: Mood normal.        Behavior: Behavior normal.        Thought Content: Thought content normal.      UC Treatments / Results  Labs (all labs ordered are listed, but only abnormal results are displayed) Labs Reviewed  RESP PANEL BY RT-PCR (RSV, FLU A&B, COVID)  [id number]    EKG   Radiology No results found.  Procedures Procedures (including critical care time)  Medications Ordered in UC Medications - No data to display  Initial Impression / Assessment and Plan / UC Course  I have reviewed the triage vital signs and the nursing notes.  Pertinent labs & imaging results that were available during my care of the patient were reviewed by me and considered in my medical decision making (see chart for details).   89 year old female presents with daughter for cough, congestion and fatigue x 2 days.  Denies fever, sore throat, chest pain or breathing difficulty.  Vitals are all normal and stable and patient is overall well-appearing.  Slight nasal congestion.  Throat clear.  Chest clear to auscultation.  Respiratory panel obtained.  Advised patient she will be contacted with results  tomorrow.  Respiratory panel negative.  Viral illness.  Supportive care was encouraged at visit.  Encouraged increasing rest and fluids.  Sent benzonatate to pharmacy.  Encouraged her to return if she develops fever, worsening cough, shortness of breath, chest pain, etc.   Final Clinical Impressions(s) / UC Diagnoses   Final diagnoses:  Viral upper respiratory tract infection  Acute cough  Nasal congestion     Discharge Instructions      URI/COLD SYMPTOMS: Your exam today is consistent with a viral illness. Antibiotics are not indicated at this time. Use medications as directed, including cough syrup, nasal saline, and decongestants. Your symptoms should improve over the next few days and resolve within 7-10 days. Increase rest and fluids. F/u if symptoms worsen or predominate such as sore throat, ear pain, productive cough, shortness of breath, or if you develop high fevers or worsening fatigue over the next several days.    Will call with results of COVID/flu test tomorrow.      ED Prescriptions     Medication Sig Dispense Auth. Provider   benzonatate (TESSALON) 200 MG capsule Take 1 capsule (200 mg total) by mouth 3 (three) times daily as needed for cough. 30 capsule Shirlee Latch, PA-C      PDMP not reviewed this encounter.   Shirlee Latch, PA-C 12/27/22 [phone]
